# Patient Record
Sex: Female | Born: 1965
Health system: Southern US, Community
[De-identification: ages and names within clinical notes are randomized; demographics above are authoritative.]

## PROBLEM LIST (undated history)

## (undated) DIAGNOSIS — K279 Peptic ulcer, site unspecified, unspecified as acute or chronic, without hemorrhage or perforation: Secondary | ICD-10-CM

## (undated) DIAGNOSIS — F17200 Nicotine dependence, unspecified, uncomplicated: Secondary | ICD-10-CM

## (undated) DIAGNOSIS — L209 Atopic dermatitis, unspecified: Secondary | ICD-10-CM

## (undated) DIAGNOSIS — Z8619 Personal history of other infectious and parasitic diseases: Secondary | ICD-10-CM

## (undated) DIAGNOSIS — E785 Hyperlipidemia, unspecified: Secondary | ICD-10-CM

## (undated) DIAGNOSIS — G43909 Migraine, unspecified, not intractable, without status migrainosus: Secondary | ICD-10-CM

## (undated) DIAGNOSIS — F909 Attention-deficit hyperactivity disorder, unspecified type: Secondary | ICD-10-CM

## (undated) HISTORY — DX: Hyperlipidemia, unspecified: E78.5

## (undated) HISTORY — PX: BACK SURGERY: SHX140

## (undated) HISTORY — DX: Personal history of other infectious and parasitic diseases: Z86.19

## (undated) HISTORY — DX: Nicotine dependence, unspecified, uncomplicated: F17.200

## (undated) HISTORY — DX: Peptic ulcer, site unspecified, unspecified as acute or chronic, without hemorrhage or perforation: K27.9

## (undated) HISTORY — DX: Migraine, unspecified, not intractable, without status migrainosus: G43.909

## (undated) HISTORY — DX: Atopic dermatitis, unspecified: L20.9

---

## 2002-01-22 ENCOUNTER — Inpatient Hospital Stay (HOSPITAL_COMMUNITY): Admission: EM | Admit: 2002-01-22 | Discharge: 2002-01-26 | Payer: Self-pay | Admitting: Psychiatry

## 2002-02-09 ENCOUNTER — Encounter: Admission: RE | Admit: 2002-02-09 | Discharge: 2002-02-09 | Payer: Self-pay | Admitting: *Deleted

## 2002-02-23 ENCOUNTER — Encounter: Admission: RE | Admit: 2002-02-23 | Discharge: 2002-02-23 | Payer: Self-pay | Admitting: *Deleted

## 2002-04-01 ENCOUNTER — Encounter: Admission: RE | Admit: 2002-04-01 | Discharge: 2002-04-01 | Payer: Self-pay | Admitting: *Deleted

## 2002-05-11 ENCOUNTER — Encounter: Admission: RE | Admit: 2002-05-11 | Discharge: 2002-05-11 | Payer: Self-pay | Admitting: *Deleted

## 2003-06-05 ENCOUNTER — Emergency Department (HOSPITAL_COMMUNITY): Admission: EM | Admit: 2003-06-05 | Discharge: 2003-06-05 | Payer: Self-pay | Admitting: Emergency Medicine

## 2003-09-04 ENCOUNTER — Emergency Department (HOSPITAL_COMMUNITY): Admission: AD | Admit: 2003-09-04 | Discharge: 2003-09-04 | Payer: Self-pay | Admitting: Internal Medicine

## 2003-10-11 ENCOUNTER — Emergency Department (HOSPITAL_COMMUNITY): Admission: EM | Admit: 2003-10-11 | Discharge: 2003-10-11 | Payer: Self-pay | Admitting: Family Medicine

## 2004-05-05 ENCOUNTER — Emergency Department (HOSPITAL_COMMUNITY): Admission: EM | Admit: 2004-05-05 | Discharge: 2004-05-05 | Payer: Self-pay | Admitting: Emergency Medicine

## 2004-05-28 ENCOUNTER — Emergency Department (HOSPITAL_COMMUNITY): Admission: EM | Admit: 2004-05-28 | Discharge: 2004-05-28 | Payer: Self-pay | Admitting: Emergency Medicine

## 2004-06-05 ENCOUNTER — Emergency Department (HOSPITAL_COMMUNITY): Admission: EM | Admit: 2004-06-05 | Discharge: 2004-06-05 | Payer: Self-pay | Admitting: Emergency Medicine

## 2004-06-06 ENCOUNTER — Ambulatory Visit: Payer: Self-pay | Admitting: Gastroenterology

## 2004-06-21 ENCOUNTER — Ambulatory Visit: Payer: Self-pay | Admitting: Gastroenterology

## 2004-06-30 ENCOUNTER — Emergency Department (HOSPITAL_COMMUNITY): Admission: EM | Admit: 2004-06-30 | Discharge: 2004-06-30 | Payer: Self-pay | Admitting: Family Medicine

## 2004-07-05 ENCOUNTER — Ambulatory Visit: Payer: Self-pay | Admitting: Gastroenterology

## 2004-07-26 ENCOUNTER — Emergency Department (HOSPITAL_COMMUNITY): Admission: EM | Admit: 2004-07-26 | Discharge: 2004-07-26 | Payer: Self-pay | Admitting: Emergency Medicine

## 2004-12-30 ENCOUNTER — Emergency Department (HOSPITAL_COMMUNITY): Admission: EM | Admit: 2004-12-30 | Discharge: 2004-12-30 | Payer: Self-pay | Admitting: Emergency Medicine

## 2005-02-14 ENCOUNTER — Other Ambulatory Visit: Admission: RE | Admit: 2005-02-14 | Discharge: 2005-02-14 | Payer: Self-pay | Admitting: Family Medicine

## 2005-04-05 ENCOUNTER — Emergency Department (HOSPITAL_COMMUNITY): Admission: EM | Admit: 2005-04-05 | Discharge: 2005-04-05 | Payer: Self-pay | Admitting: Emergency Medicine

## 2005-08-29 ENCOUNTER — Emergency Department (HOSPITAL_COMMUNITY): Admission: EM | Admit: 2005-08-29 | Discharge: 2005-08-29 | Payer: Self-pay | Admitting: Emergency Medicine

## 2005-12-23 ENCOUNTER — Emergency Department (HOSPITAL_COMMUNITY): Admission: EM | Admit: 2005-12-23 | Discharge: 2005-12-23 | Payer: Self-pay | Admitting: Emergency Medicine

## 2006-02-28 ENCOUNTER — Ambulatory Visit: Payer: Self-pay | Admitting: Gastroenterology

## 2006-03-04 ENCOUNTER — Ambulatory Visit: Payer: Self-pay | Admitting: Gastroenterology

## 2006-03-14 ENCOUNTER — Ambulatory Visit: Payer: Self-pay | Admitting: Gastroenterology

## 2006-03-15 ENCOUNTER — Emergency Department (HOSPITAL_COMMUNITY): Admission: EM | Admit: 2006-03-15 | Discharge: 2006-03-15 | Payer: Self-pay | Admitting: Emergency Medicine

## 2006-04-03 ENCOUNTER — Encounter: Admission: RE | Admit: 2006-04-03 | Discharge: 2006-04-03 | Payer: Self-pay | Admitting: Family Medicine

## 2006-04-09 ENCOUNTER — Ambulatory Visit: Payer: Self-pay | Admitting: Gastroenterology

## 2006-04-09 LAB — CONVERTED CEMR LAB
Fecal Occult Blood: NEGATIVE
OCCULT 5: NEGATIVE

## 2006-07-26 ENCOUNTER — Ambulatory Visit: Payer: Self-pay | Admitting: Gastroenterology

## 2007-07-25 ENCOUNTER — Encounter: Admission: RE | Admit: 2007-07-25 | Discharge: 2007-07-25 | Payer: Self-pay | Admitting: Neurology

## 2007-10-23 ENCOUNTER — Ambulatory Visit: Payer: Self-pay | Admitting: Family Medicine

## 2007-12-16 ENCOUNTER — Ambulatory Visit: Payer: Self-pay | Admitting: Family Medicine

## 2008-01-19 ENCOUNTER — Encounter: Payer: Self-pay | Admitting: Gastroenterology

## 2008-02-16 ENCOUNTER — Other Ambulatory Visit: Admission: RE | Admit: 2008-02-16 | Discharge: 2008-02-16 | Payer: Self-pay | Admitting: Family Medicine

## 2008-02-16 ENCOUNTER — Ambulatory Visit: Payer: Self-pay | Admitting: Family Medicine

## 2008-02-16 LAB — HM PAP SMEAR: HM Pap smear: NEGATIVE

## 2008-05-04 ENCOUNTER — Ambulatory Visit: Payer: Self-pay | Admitting: Family Medicine

## 2008-07-01 ENCOUNTER — Ambulatory Visit: Payer: Self-pay | Admitting: Family Medicine

## 2008-07-05 ENCOUNTER — Telehealth: Payer: Self-pay | Admitting: Gastroenterology

## 2008-07-06 ENCOUNTER — Telehealth: Payer: Self-pay | Admitting: Gastroenterology

## 2008-07-12 ENCOUNTER — Encounter: Payer: Self-pay | Admitting: Physician Assistant

## 2008-07-12 ENCOUNTER — Ambulatory Visit: Payer: Self-pay | Admitting: Gastroenterology

## 2008-07-12 DIAGNOSIS — R197 Diarrhea, unspecified: Secondary | ICD-10-CM | POA: Insufficient documentation

## 2008-07-12 DIAGNOSIS — G43909 Migraine, unspecified, not intractable, without status migrainosus: Secondary | ICD-10-CM

## 2008-07-12 DIAGNOSIS — S060X9A Concussion with loss of consciousness of unspecified duration, initial encounter: Secondary | ICD-10-CM

## 2008-07-12 DIAGNOSIS — K6289 Other specified diseases of anus and rectum: Secondary | ICD-10-CM

## 2008-07-12 DIAGNOSIS — S060XAA Concussion with loss of consciousness status unknown, initial encounter: Secondary | ICD-10-CM | POA: Insufficient documentation

## 2008-07-13 LAB — CONVERTED CEMR LAB
Basophils Relative: 0.2 % (ref 0.0–3.0)
Eosinophils Relative: 0.6 % (ref 0.0–5.0)
Hemoglobin: 14.2 g/dL (ref 12.0–15.0)
Lymphocytes Relative: 15 % (ref 12.0–46.0)
Monocytes Relative: 4.2 % (ref 3.0–12.0)
Neutro Abs: 5.6 10*3/uL (ref 1.4–7.7)
RBC: 4.36 M/uL (ref 3.87–5.11)

## 2008-07-14 ENCOUNTER — Telehealth: Payer: Self-pay | Admitting: Physician Assistant

## 2008-07-20 ENCOUNTER — Telehealth: Payer: Self-pay | Admitting: Gastroenterology

## 2008-08-10 ENCOUNTER — Ambulatory Visit: Payer: Self-pay | Admitting: Gastroenterology

## 2008-09-08 ENCOUNTER — Emergency Department (HOSPITAL_COMMUNITY): Admission: EM | Admit: 2008-09-08 | Discharge: 2008-09-08 | Payer: Self-pay | Admitting: Emergency Medicine

## 2008-09-21 ENCOUNTER — Ambulatory Visit: Payer: Self-pay | Admitting: Family Medicine

## 2008-09-28 ENCOUNTER — Ambulatory Visit: Payer: Self-pay | Admitting: Family Medicine

## 2008-11-29 ENCOUNTER — Ambulatory Visit (HOSPITAL_COMMUNITY): Payer: Self-pay | Admitting: Psychiatry

## 2008-12-23 ENCOUNTER — Ambulatory Visit: Payer: Self-pay | Admitting: Family Medicine

## 2009-02-04 ENCOUNTER — Emergency Department (HOSPITAL_COMMUNITY): Admission: EM | Admit: 2009-02-04 | Discharge: 2009-02-04 | Payer: Self-pay | Admitting: Emergency Medicine

## 2009-03-11 ENCOUNTER — Ambulatory Visit: Payer: Self-pay | Admitting: Family Medicine

## 2009-03-30 ENCOUNTER — Ambulatory Visit (HOSPITAL_COMMUNITY): Payer: Self-pay | Admitting: Psychiatry

## 2009-04-01 ENCOUNTER — Ambulatory Visit: Payer: Self-pay | Admitting: Family Medicine

## 2009-04-28 ENCOUNTER — Ambulatory Visit: Payer: Self-pay | Admitting: Family Medicine

## 2009-06-01 ENCOUNTER — Ambulatory Visit: Payer: Self-pay | Admitting: Family Medicine

## 2009-06-08 ENCOUNTER — Ambulatory Visit (HOSPITAL_COMMUNITY): Payer: Self-pay | Admitting: Psychiatry

## 2009-07-11 ENCOUNTER — Ambulatory Visit: Payer: Self-pay | Admitting: Family Medicine

## 2009-07-29 ENCOUNTER — Ambulatory Visit (HOSPITAL_COMMUNITY): Payer: Self-pay | Admitting: Licensed Clinical Social Worker

## 2009-09-26 ENCOUNTER — Ambulatory Visit (HOSPITAL_COMMUNITY): Payer: Self-pay | Admitting: Psychiatry

## 2009-10-05 ENCOUNTER — Ambulatory Visit: Payer: Self-pay | Admitting: Family Medicine

## 2009-11-11 ENCOUNTER — Telehealth: Payer: Self-pay | Admitting: Gastroenterology

## 2009-11-15 ENCOUNTER — Ambulatory Visit: Payer: Self-pay | Admitting: Family Medicine

## 2009-11-23 ENCOUNTER — Ambulatory Visit: Payer: Self-pay | Admitting: Family Medicine

## 2009-11-29 ENCOUNTER — Ambulatory Visit: Payer: Self-pay | Admitting: Family Medicine

## 2010-01-09 ENCOUNTER — Ambulatory Visit (HOSPITAL_COMMUNITY): Payer: Self-pay | Admitting: Psychiatry

## 2010-03-21 ENCOUNTER — Ambulatory Visit: Payer: Self-pay | Admitting: Family Medicine

## 2010-05-03 ENCOUNTER — Ambulatory Visit: Payer: Self-pay | Admitting: Family Medicine

## 2010-06-11 ENCOUNTER — Encounter: Payer: Self-pay | Admitting: Neurology

## 2010-06-22 NOTE — Progress Notes (Signed)
Summary: speak ot nurse  Phone Note Call from Patient Call back at [phone]   Caller: Patient Call For: Jarold Motto Reason for Call: Talk to Nurse Summary of Call: Patient wants to speak to nurse regardig meds that she's taking a since January. Initial call taken by: Tawni Levy,  November 11, 2009 8:06 AM  Follow-up for Phone Call        Pt has been on antiobiotics six times this year for sinus infection.  She is currently on amoxicillin and EES. She has had some diarrhea while on the antiobiotic.  Diarrhea goes away when finishes meds.  Pt concerned about taking so many antiobiotics.  Pt incouraged to take a probiotic while on antiobiotic.  Pt instructed to call for OV if diarrhea worsens or does not go away after completing course of meds. Follow-up by: Ashok Cordia RN,  November 11, 2009 8:31 AM

## 2010-07-14 LAB — HM PAP SMEAR

## 2010-07-14 LAB — RESULTS CONSOLE HPV: CHL HPV: NEGATIVE

## 2010-08-30 LAB — URINALYSIS, ROUTINE W REFLEX MICROSCOPIC
Ketones, ur: NEGATIVE mg/dL
Leukocytes, UA: NEGATIVE
Nitrite: NEGATIVE
Specific Gravity, Urine: 1.004 — ABNORMAL LOW (ref 1.005–1.030)
pH: 6.5 (ref 5.0–8.0)

## 2010-08-30 LAB — PREGNANCY, URINE: Preg Test, Ur: NEGATIVE

## 2010-10-06 NOTE — Discharge Summary (Signed)
NAME:  Vanessa Sutton, Vanessa Sutton NO.:  [id number]   MEDICAL RECORD NO.:  [id number]                   PATIENT TYPE:  IPS   LOCATION:  0503                                 [facility]ILITY[facility]  [facility]   PHYSICIAN:  Jeanice Lim, M.D.              DATE OF BIRTH:  05/25/1965   DATE OF ADMISSION:  01/22/2002  DATE OF DISCHARGE:  01/26/2002                                 DISCHARGE SUMMARY   IDENTIFYING DATA:  This is a 45 year old Caucasian female, divorced,  voluntarily admitted, presented requesting detox.  Drinking since the age of  24.   MEDICATIONS:  Atenolol, Prozac, Imitrex, Percocet.   ALLERGIES:  No known drug allergies.   PHYSICAL EXAMINATION:  Essentially within normal limits.  Neurologically  nonfocal.   LABORATORY DATA:  SGOT and SGPT slightly elevated at 41 and 47.   MENTAL STATUS EXAM:  Medium-built, fully alert, mildly anxious, fidgeting  female.  Speech was within normal limits.  Mood euthymic.  Affect  restricted.  Thought processes goal directed.  Thought content positive  guilt associated with drinking, effects on family.  No dangerous or  psychotic symptoms.  Cognitively intact.  Reporting mild withdrawal  symptoms.   ADMISSION DIAGNOSES:   AXIS I:  1. Alcohol dependence.  2. Depression not otherwise specified.   AXIS II:  None.   AXIS III:  History of migraine headaches.   AXIS IV:  Moderate (problems with primary support group).   AXIS V:  29/60.   HOSPITAL COURSE:  The patient was admitted and ordered routine p.r.n.  medications.  Was placed on a Librium detox protocol for safe withdrawal and  monitored.  The patient tolerated detox.  Reported a decrease in withdrawal  symptoms.  Had no complications.   CONDITION ON DISCHARGE:  Markedly improved.  Mood was more euthymic.  Affect  brighter.  Thought processes goal directed.  Thought content negative for  dangerous ideation and psychotic symptoms.  The patient reported no acute  withdrawal symptoms and motivation to remain sober.   DISCHARGE MEDICATIONS:  1. Prozac 20 mg q.a.m.  2. Atenolol 50 mg at 6 p.m.  3. Trazodone 50 mg q.h.s. p.r.n.   FOLLOW UP:  Hipolito Bayley, M.D. on February 09, 2002 at 2 p.m. and with  AA.   DISCHARGE DIAGNOSES:   AXIS I:  1. Alcohol dependence.  2. Depression not otherwise specified.   AXIS II:  None.   AXIS III:  History of migraine headaches.   AXIS IV:  Moderate (problems with primary support group).   AXIS V:  Global Assessment of Functioning on discharge 55.                                                Jeanice Lim, M.D.  JEM/MEDQ  D:  02/26/2002  T:  02/26/2002  Job:  [id number]

## 2010-10-06 NOTE — Assessment & Plan Note (Signed)
 HEALTHCARE                           GASTROENTEROLOGY OFFICE NOTE   NAME:Vanessa Sutton, Vanessa Sutton                    MRN:          [id number]  DATE:03/14/2006                            DOB:          02/14/1966    HISTORY OF PRESENT ILLNESS:  Vanessa Sutton is much better on the AcipHex therapy,  which I have asked her to continue.  Her endoscopy was unremarkable in  February of 2006 and she had a negative RUT titer.  It was felt at this time  that she most likely had recurrent NSAID-induced ulceration and she  responded well to empiric therapy from her last visit on February 28, 2006.  She did have ultrasound exam performed on March 05, 2006 which was  entirely normal.   PHYSICAL EXAMINATION:  VITAL SIGNS:  Her vital signs are all stable.  Her  weight is 153 pounds.  ABDOMEN:  There is some slight epigastric tenderness but the abdominal exam  otherwise is unremarkable.   PLAN:  I will leave Vanessa Sutton on maintenance AcipHex and check her back in a  couple of months time for followup.  I told her she could probably take  Celebrex without problems for migraine headaches; this is being managed in  [place] through the headache clinic.     Vanessa Sutton. Jarold Motto, MD, Caleen Essex, FAGA    DRP/MedQ  DD: 03/14/2006  DT: 03/15/2006  Job #: [id number]
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
## 2010-10-06 NOTE — Assessment & Plan Note (Signed)
[facility]                         GASTROENTEROLOGY OFFICE NOTE   NAME:Sutton, Vanessa CLONTZ                    MRN:          [id number]  DATE:07/26/2006                            DOB:          1966-04-19    Vanessa Sutton has probably what sounds like cervical spinal stenosis and is  under care of Dr. Avie Echevaria. She denies NSAID use this time but has  taken Kadian 20 mg a day for pain control. She has recently had  recurrent epigastric abdominal pain which we previously saw her last  year. She had a fairly unremarkable endoscopy on July 05, 2004 with  a negative biopsy for Helicobacter pylori. It was felt she most likely  had a resolving NSAID ulceration. She has used AcipHex intermittently  since then. She now describes 2 weeks of burning epigastric abdominal  pain made worse by eating. She has had a previous negative ultrasound on  March 04, 2006. She denies use of aspirin or NSAIDs. Her other  medications;  1. Lorazepam 1 mg a bedtime.  2. Tizanidine 3 times a day.  3. Kadian.  4. She uses p.r.n. migraine nasal sprays and Imitrex injections.   Her appetite is good and her weight is stable. She is having some  constipation associated with her narcotic use. She has had no rectal  bleeding, and had a negative colonoscopy exam on July 05, 2004.   She is a healthy-appearing white female appearing her stated age. She  weighs 145 pounds and blood pressure of 116/78. Pulse was 88 and  regular. I could not appreciate a stigmata of chronic liver disease.  There was no hepatosplenomegaly, abdominal masses, or tenderness. Bowel  sounds were normal.   ASSESSMENT:  1. Chronic recurrent dyspepsia of unexplained etiology. She really      denies true acid reflux symptoms, and denies NSAID use at this      time. Previous ultrasound exam has been normal.  2. Chronic neck pain associated with neurologic disorder, diagnosis      unclear to me at this time.  3. Chronic functional constipation related to pain medications.   RECOMMENDATIONS:  1. Restart AcipHex 20 mg  a day for 4 to 6 weeks as needed.  2. Trial of Amitiza 24 mcg twice a day with meals for constipation.  3. Will ask Dr. Sandria Manly to send [place] his copy of his neurologic report for      our records.  4. Other medications as per her primary care physician Dr. Talmadge Coventry.     Vanessa Rea. Jarold Motto, MD, Caleen Essex, FAGA  Electronically Signed    DRP/MedQ  DD: 07/26/2006  DT: 07/26/2006  Job #: [id number]   cc:   Genene Churn. Love, M.D.  Talmadge Coventry, M.D.

## 2010-12-15 ENCOUNTER — Telehealth: Payer: Self-pay | Admitting: Family Medicine

## 2010-12-15 MED ORDER — AMPHETAMINE-DEXTROAMPHET ER 30 MG PO CP24: 30.0000 mg | ORAL_CAPSULE | Freq: Two times a day (BID) | ORAL | Status: DC

## 2010-12-15 MED ORDER — AMPHETAMINE-DEXTROAMPHET ER 30 MG PO CP24: 30.0000 mg | ORAL_CAPSULE | ORAL | Status: DC

## 2010-12-15 NOTE — Telephone Encounter (Signed)
Adderall renewed for 3 months 

## 2011-03-15 ENCOUNTER — Telehealth: Payer: Self-pay | Admitting: Family Medicine

## 2011-03-15 MED ORDER — AMPHETAMINE-DEXTROAMPHET ER 30 MG PO CP24: 30.0000 mg | ORAL_CAPSULE | ORAL | Status: DC

## 2011-03-15 NOTE — Telephone Encounter (Signed)
Adderall prescription written. She will need an appointment.

## 2011-03-15 NOTE — Telephone Encounter (Signed)
Pt is aware of refill on adderall and knows she needs to come in for a follow-up on medicine

## 2011-03-15 NOTE — Telephone Encounter (Signed)
Her Adderall prescription was written however have her set up an appointment for followup.

## 2011-04-13 ENCOUNTER — Telehealth: Payer: Self-pay | Admitting: Internal Medicine

## 2011-04-16 MED ORDER — AMPHETAMINE-DEXTROAMPHET ER 30 MG PO CP24: 30.0000 mg | ORAL_CAPSULE | ORAL | Status: DC

## 2011-04-16 NOTE — Telephone Encounter (Signed)
Adderall XR30 mg Rx written

## 2011-05-11 ENCOUNTER — Telehealth: Payer: Self-pay | Admitting: Internal Medicine

## 2011-05-11 ENCOUNTER — Telehealth: Payer: Self-pay | Admitting: Family Medicine

## 2011-05-11 MED ORDER — AMPHETAMINE-DEXTROAMPHET ER 30 MG PO CP24: 30.0000 mg | ORAL_CAPSULE | ORAL | Status: DC

## 2011-05-11 NOTE — Telephone Encounter (Signed)
pt needs a refill for adderall XR 30mg  twice a day but is leaving out of town for 2 weeks on the 24th and needs the prescription written for 12/26 because she will get this refill out of town. Also it is time for her yearly check up but her insurance is messed up and she is trying to get that fixed so it will be January before she can come in. Please call her when prescription is ready. Cell # [phone]

## 2011-05-11 NOTE — Telephone Encounter (Signed)
DONE

## 2011-05-11 NOTE — Telephone Encounter (Signed)
Adderall prescription written 

## 2011-05-20 DIAGNOSIS — M25529 Pain in unspecified elbow: Secondary | ICD-10-CM | POA: Insufficient documentation

## 2011-05-21 ENCOUNTER — Emergency Department (INDEPENDENT_AMBULATORY_CARE_PROVIDER_SITE_OTHER): Payer: Medicare Other

## 2011-05-21 ENCOUNTER — Encounter: Payer: Self-pay | Admitting: *Deleted

## 2011-05-21 ENCOUNTER — Emergency Department (HOSPITAL_BASED_OUTPATIENT_CLINIC_OR_DEPARTMENT_OTHER)
Admission: EM | Admit: 2011-05-21 | Discharge: 2011-05-21 | Disposition: A | Discharge status: Home / Self Care | Payer: Medicare Other | Attending: Emergency Medicine | Admitting: Emergency Medicine

## 2011-05-21 DIAGNOSIS — M25522 Pain in left elbow: Secondary | ICD-10-CM

## 2011-05-21 DIAGNOSIS — M79609 Pain in unspecified limb: Secondary | ICD-10-CM

## 2011-05-21 HISTORY — DX: Attention-deficit hyperactivity disorder, unspecified type: F90.9

## 2011-05-21 MED ORDER — HYDROCODONE-ACETAMINOPHEN 5-325 MG PO TABS: 1.0000 | ORAL_TABLET | ORAL | Status: AC | PRN

## 2011-05-21 MED ORDER — IBUPROFEN 600 MG PO TABS: 600.0000 mg | ORAL_TABLET | Freq: Three times a day (TID) | ORAL | Status: AC | PRN

## 2011-05-21 MED ORDER — OXYCODONE-ACETAMINOPHEN 5-325 MG PO TABS
2.0000 | ORAL_TABLET | Freq: Once | ORAL | Status: AC
  Administered 2011-05-21: 1 via ORAL
  Filled 2011-05-21: qty 1

## 2011-05-21 NOTE — ED Notes (Signed)
Pt only given one oxycodone per request, MD aware. +PMS post sling application.

## 2011-05-21 NOTE — ED Notes (Signed)
C/o left lower arm pain x 1 month after injuring it

## 2011-05-21 NOTE — ED Notes (Signed)
Patient transported to X-ray 

## 2011-05-21 NOTE — ED Provider Notes (Signed)
History    This chart was scribed for Lyanne Co, MD, MD by Smitty Pluck. The patient was seen in room MH04 and the patient's care was started at 12:34AM.   CSN: [id number]  Arrival date & time 05/20/11  2347   None     Chief Complaint  Patient presents with  . Arm Injury    (Consider location/radiation/quality/duration/timing/severity/associated sxs/prior treatment) Patient is a 45 y.o. female presenting with arm injury. The history is provided by the patient.  Arm Injury    Vanessa Sutton is a 45 y.o. female who presents to the Emergency Department complaining of moderate left elbow pain onset 1 month ago while working on deck. Pt hit the elbow on marble surface. Pt reports using elbow brace with no relief. Pt reports the pain is constant without radiation. Lifting objects and rotation about the elbow aggravates the pain. Pt reports her arm being stiff during the day. Pt reports hearing "popping" sound while moving her left arm. She denies taking medication for pain. Pt has a hx of bleeding ulcers.   Past Medical History  Diagnosis Date  . ADHD (attention deficit hyperactivity disorder)     History reviewed. No pertinent past surgical history.  History reviewed. No pertinent family history.  History  Substance Use Topics  . Smoking status: Never Smoker   . Smokeless tobacco: Not on file  . Alcohol Use: Yes    OB History    Grav Para Term Preterm Abortions TAB SAB Ect Mult Living                  Review of Systems  All other systems reviewed and are negative.   10 Systems reviewed and are negative for acute change except as noted in the HPI.  Allergies  Review of patient's allergies indicates no known allergies.  Home Medications   Current Outpatient Rx  Name Route Sig Dispense Refill  . AMPHETAMINE-DEXTROAMPHET ER 30 MG PO CP24 Oral Take 1 capsule (30 mg total) by mouth every morning. 60 capsule 0    BP 114/79  Pulse 99  Temp(Src) 97.5 F (36.4  C) (Oral)  Resp 16  Ht 5\' 2"  (1.575 m)  Wt 130 lb (58.968 kg)  BMI 23.78 kg/m2  SpO2 100%  LMP 05/07/2011  Physical Exam  Nursing note and vitals reviewed. Constitutional: She is oriented to person, place, and time. She appears well-developed and well-nourished. No distress.  HENT:  Head: Normocephalic and atraumatic.  Eyes: EOM are normal. Pupils are equal, round, and reactive to light.  Neck: Normal range of motion. Neck supple. No tracheal deviation present.  Cardiovascular: Normal rate, regular rhythm and normal heart sounds.        Nl radial pulses bilaterally  Pulmonary/Chest: Effort normal. No respiratory distress.  Abdominal: Soft. She exhibits no distension. There is no tenderness.  Musculoskeletal: Normal range of motion.       No warmth or swelling of left upper extremity Tenderness over left radial head and olecranon Pain with supination of left forearm   No tenderness or swelling of left humerus Nl left clavicle  Nl rom of left shoulder  Pain with flexion of left elbow   Neurological: She is alert and oriented to person, place, and time.  Skin: Skin is warm and dry.  Psychiatric: She has a normal mood and affect. Her behavior is normal.    ED Course  Procedures (including critical care time)  DIAGNOSTIC STUDIES: Oxygen Saturation is 100% on room  air, normal by my interpretation.    COORDINATION OF CARE:    Labs Reviewed - No data to display Dg Forearm Left  05/21/2011  *RADIOLOGY REPORT*  Clinical Data: Left lower arm pain 1 month after injury.  LEFT FOREARM - 2 VIEW  Comparison: None.  Findings: The left radius and ulna appear intact.  No evidence of acute fracture or subluxation.  No focal bone lesion or bone destruction.  Bone cortex and trabecular architecture appear intact.  No abnormal radiopaque foreign bodies in the soft tissues.  IMPRESSION: No acute bony abnormalities identified.  Original Report Authenticated By: Marlon Pel, M.D.   I  personally reviewed the x-ray  1. Left elbow pain       MDM  X-ray is normal here in the emergency department.  The patient was sent home with a prescription for pain medication as well as a sling for comfort.  She's been referred to sports medicine for followup      I personally performed the services described in this documentation, which was scribed in my presence. The recorded information has been reviewed and considered.    Lyanne Co, MD 05/21/11 [phone]

## 2011-06-18 ENCOUNTER — Telehealth: Payer: Self-pay | Admitting: Family Medicine

## 2011-06-18 MED ORDER — AMPHETAMINE-DEXTROAMPHET ER 30 MG PO CP24: 30.0000 mg | ORAL_CAPSULE | ORAL | Status: DC

## 2011-06-18 NOTE — Telephone Encounter (Signed)
Adderall XR 30 renewed 

## 2011-06-26 ENCOUNTER — Encounter: Payer: Self-pay | Admitting: Internal Medicine

## 2011-07-02 ENCOUNTER — Encounter: Payer: Medicare Other | Admitting: Family Medicine

## 2011-07-16 ENCOUNTER — Encounter: Payer: Medicare Other | Admitting: Family Medicine

## 2011-07-27 ENCOUNTER — Telehealth: Payer: Self-pay | Admitting: Family Medicine

## 2011-07-27 MED ORDER — AMPHETAMINE-DEXTROAMPHET ER 30 MG PO CP24: 30.0000 mg | ORAL_CAPSULE | ORAL | Status: DC

## 2011-07-27 NOTE — Telephone Encounter (Signed)
Adderall renewed. She has an appointment in early April.

## 2011-08-22 ENCOUNTER — Encounter: Payer: Medicare Other | Admitting: Family Medicine

## 2011-08-29 ENCOUNTER — Telehealth: Payer: Self-pay | Admitting: Family Medicine

## 2011-08-29 NOTE — Telephone Encounter (Signed)
LM

## 2011-09-05 ENCOUNTER — Telehealth: Payer: Self-pay | Admitting: Family Medicine

## 2011-09-05 NOTE — Telephone Encounter (Signed)
she needs to come in for an appointment. Check the record but I think she has canceled or not shown for the last couple of appointments.

## 2011-09-05 NOTE — Telephone Encounter (Signed)
Pt informed that no more refills till she comes in for appt pt said she would call back

## 2011-10-01 ENCOUNTER — Ambulatory Visit (INDEPENDENT_AMBULATORY_CARE_PROVIDER_SITE_OTHER): Payer: Self-pay | Admitting: Family Medicine

## 2011-10-01 ENCOUNTER — Encounter: Payer: Self-pay | Admitting: Family Medicine

## 2011-10-01 VITALS — BP 150/80 | HR 114 | Wt 128.0 lb

## 2011-10-01 DIAGNOSIS — F988 Other specified behavioral and emotional disorders with onset usually occurring in childhood and adolescence: Secondary | ICD-10-CM

## 2011-10-01 DIAGNOSIS — G43909 Migraine, unspecified, not intractable, without status migrainosus: Secondary | ICD-10-CM

## 2011-10-01 MED ORDER — AMPHETAMINE-DEXTROAMPHET ER 30 MG PO CP24: 30.0000 mg | ORAL_CAPSULE | ORAL | Status: DC

## 2011-10-01 MED ORDER — AMPHETAMINE-DEXTROAMPHET ER 30 MG PO CP24: 30.0000 mg | ORAL_CAPSULE | Freq: Two times a day (BID) | ORAL | Status: DC

## 2011-10-01 MED ORDER — PROMETHAZINE HCL 50 MG PO TABS: 50.0000 mg | ORAL_TABLET | Freq: Four times a day (QID) | ORAL | Status: DC | PRN
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
## 2011-12-27 ENCOUNTER — Telehealth: Payer: Self-pay | Admitting: Family Medicine

## 2011-12-27 MED ORDER — AMPHETAMINE-DEXTROAMPHET ER 30 MG PO CP24: 30.0000 mg | ORAL_CAPSULE | Freq: Two times a day (BID) | ORAL | Status: DC

## 2011-12-27 MED ORDER — AMPHETAMINE-DEXTROAMPHET ER 30 MG PO CP24: 30.0000 mg | ORAL_CAPSULE | ORAL | Status: DC

## 2011-12-27 NOTE — Telephone Encounter (Signed)
Adderall renewed.

## 2011-12-27 NOTE — Telephone Encounter (Signed)
LM

## 2012-02-24 IMAGING — CR DG FOREARM 2V*L*
2 series · 2 of 2 positions shown · non-contrast
Comparison: None.

CLINICAL DATA: Left lower arm pain 1 month after injury.

LEFT FOREARM - 2 VIEW

[x forearm ap left]
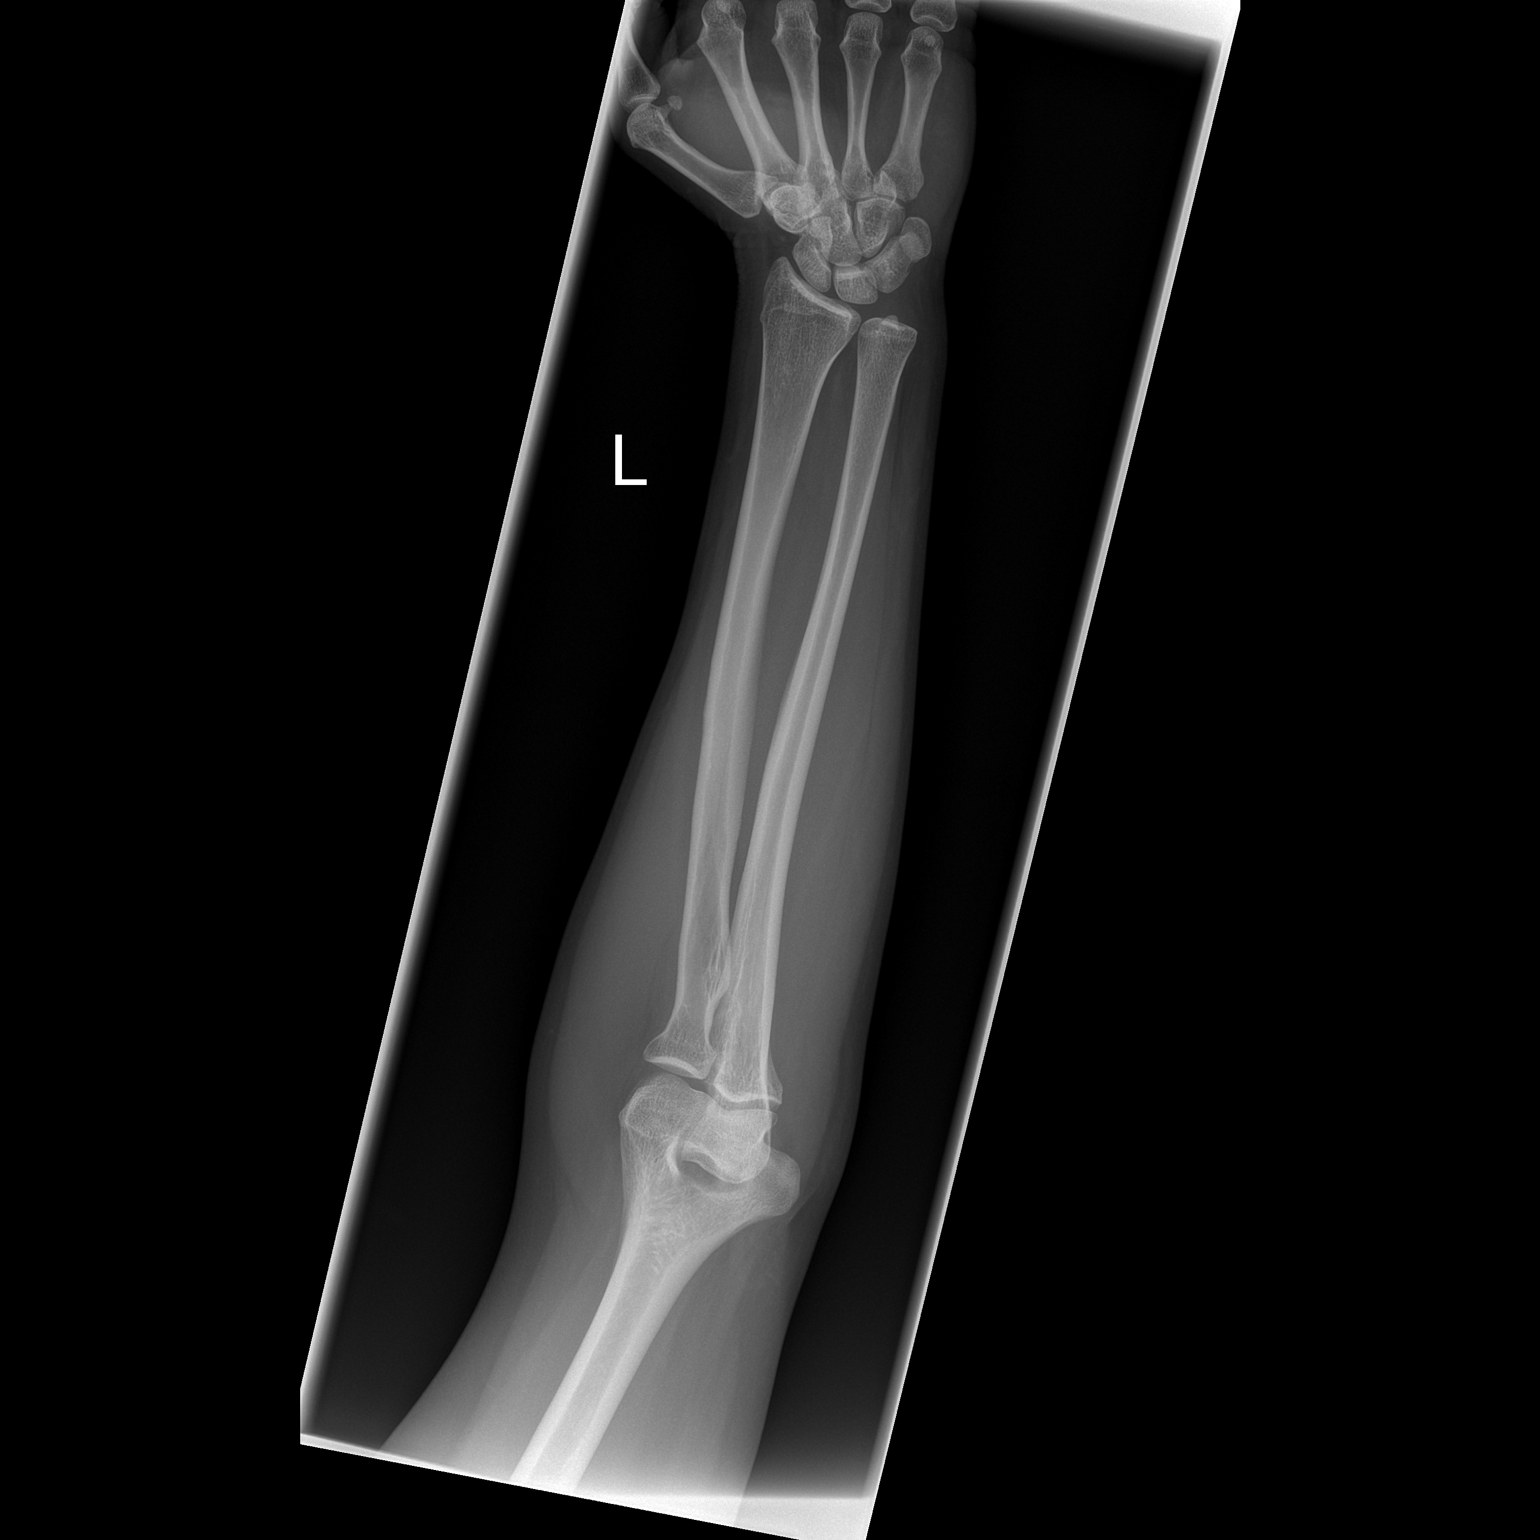

[x forearm lat left]
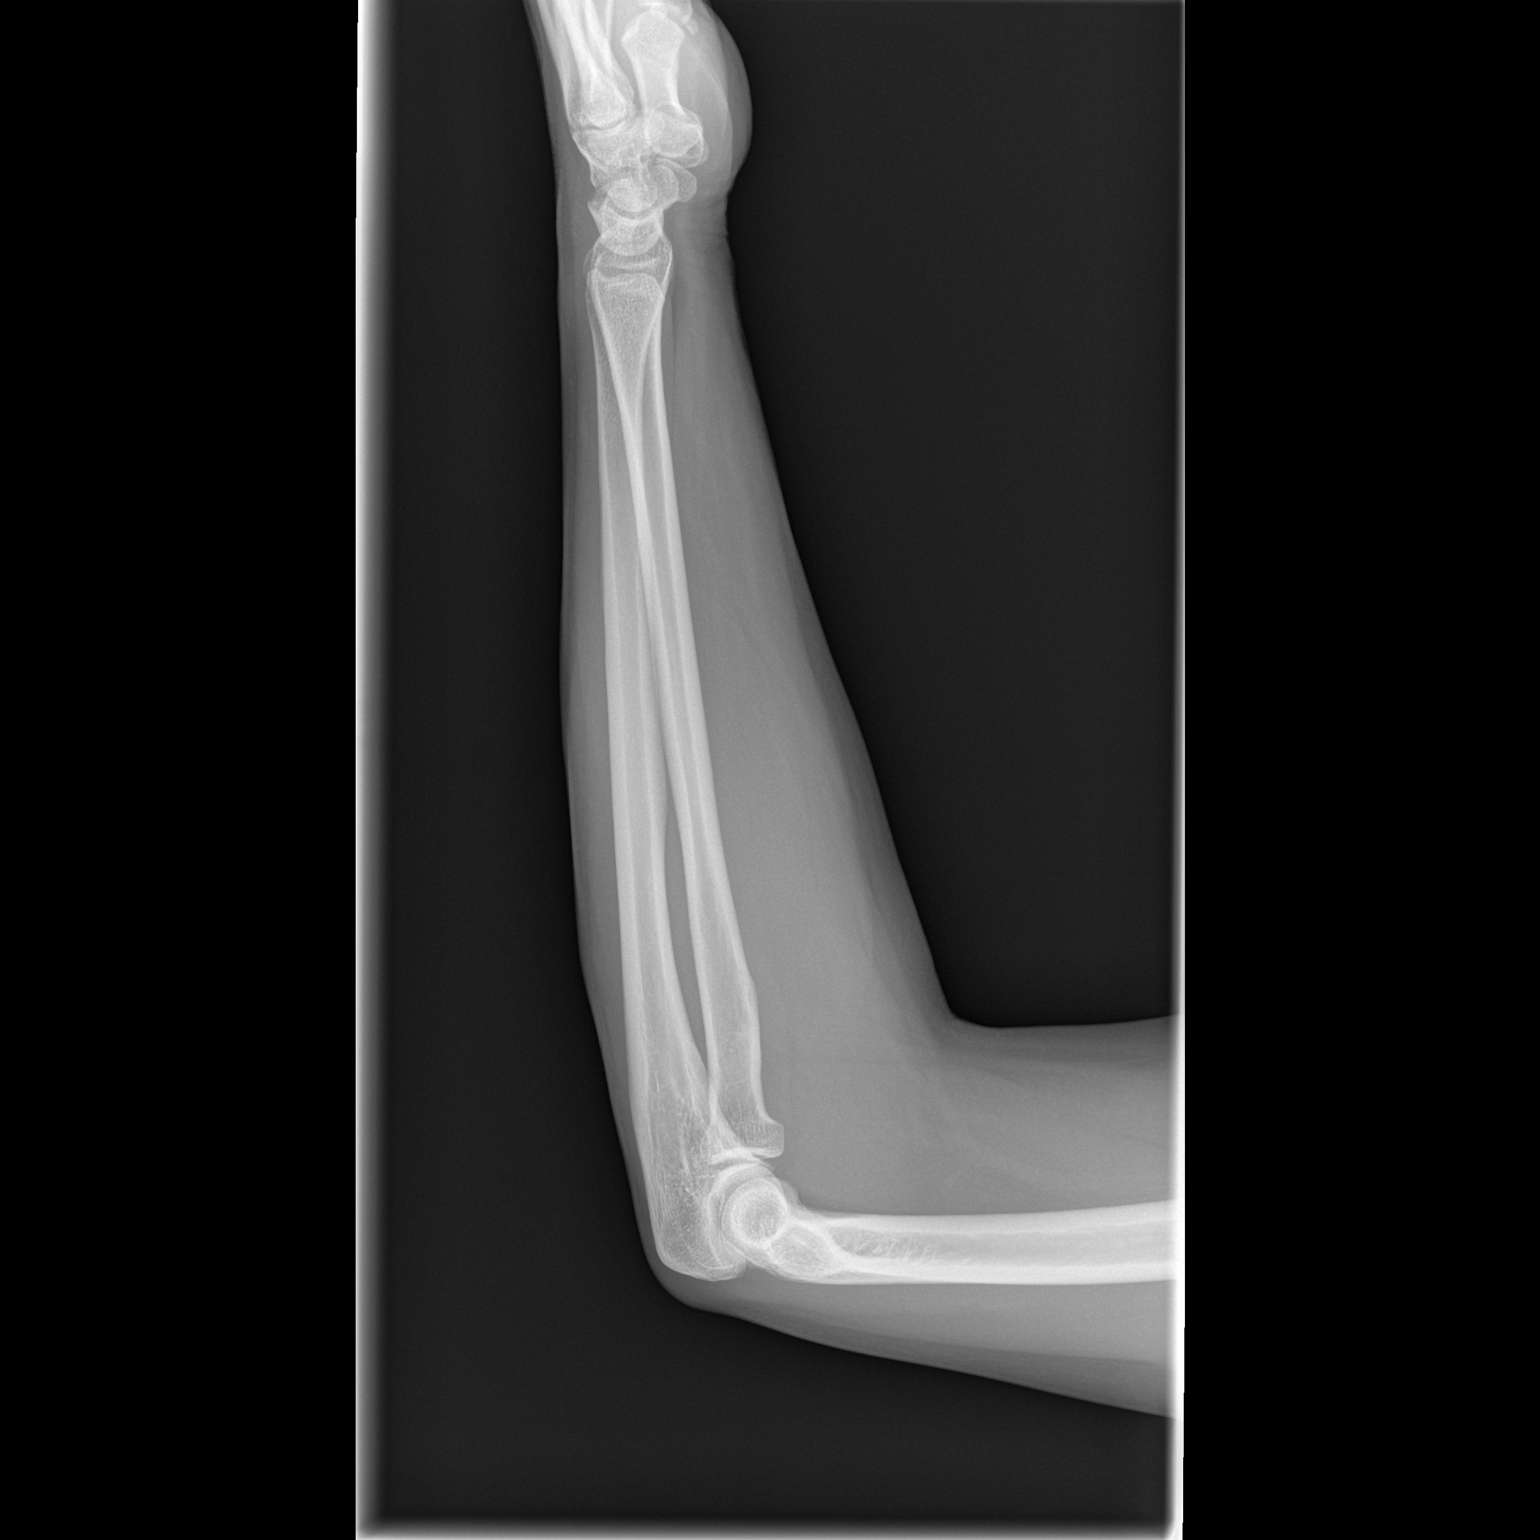

[2 of 2 positions shown; findings below may reference images not displayed]

FINDINGS: The left radius and ulna appear intact.  No evidence of
acute fracture or subluxation.  No focal bone lesion or bone
destruction.  Bone cortex and trabecular architecture appear
intact.  No abnormal radiopaque foreign bodies in the soft tissues.
IMPRESSION: No acute bony abnormalities identified.

## 2012-02-27 ENCOUNTER — Ambulatory Visit: Payer: Self-pay | Admitting: Medical

## 2012-02-29 ENCOUNTER — Emergency Department (HOSPITAL_COMMUNITY)
Admission: EM | Admit: 2012-02-29 | Discharge: 2012-02-29 | Disposition: A | Discharge status: Home / Self Care | Payer: Medicare Other | Attending: Emergency Medicine | Admitting: Emergency Medicine

## 2012-02-29 ENCOUNTER — Encounter (HOSPITAL_COMMUNITY): Payer: Self-pay | Admitting: Emergency Medicine

## 2012-02-29 DIAGNOSIS — IMO0001 Reserved for inherently not codable concepts without codable children: Secondary | ICD-10-CM | POA: Diagnosis not present

## 2012-02-29 DIAGNOSIS — A6000 Herpesviral infection of urogenital system, unspecified: Secondary | ICD-10-CM | POA: Insufficient documentation

## 2012-02-29 DIAGNOSIS — Z888 Allergy status to other drugs, medicaments and biological substances status: Secondary | ICD-10-CM | POA: Diagnosis not present

## 2012-02-29 DIAGNOSIS — F909 Attention-deficit hyperactivity disorder, unspecified type: Secondary | ICD-10-CM | POA: Insufficient documentation

## 2012-02-29 DIAGNOSIS — F17200 Nicotine dependence, unspecified, uncomplicated: Secondary | ICD-10-CM | POA: Diagnosis not present

## 2012-02-29 DIAGNOSIS — T63301A Toxic effect of unspecified spider venom, accidental (unintentional), initial encounter: Secondary | ICD-10-CM

## 2012-02-29 DIAGNOSIS — T63391A Toxic effect of venom of other spider, accidental (unintentional), initial encounter: Secondary | ICD-10-CM | POA: Insufficient documentation

## 2012-02-29 DIAGNOSIS — E785 Hyperlipidemia, unspecified: Secondary | ICD-10-CM | POA: Insufficient documentation

## 2012-02-29 DIAGNOSIS — Y9210 Unspecified residential institution as the place of occurrence of the external cause: Secondary | ICD-10-CM | POA: Insufficient documentation

## 2012-02-29 MED ORDER — DIPHENHYDRAMINE HCL 25 MG PO CAPS: 50.0000 mg | ORAL_CAPSULE | Freq: Once | ORAL | Status: DC

## 2012-02-29 MED ORDER — TETANUS-DIPHTH-ACELL PERTUSSIS 5-2.5-18.5 LF-MCG/0.5 IM SUSP
0.5000 mL | Freq: Once | INTRAMUSCULAR | Status: AC
  Administered 2012-02-29: 0.5 mL via INTRAMUSCULAR
  Filled 2012-02-29: qty 0.5

## 2012-02-29 MED ORDER — ERYTHROMYCIN BASE 250 MG PO TABS: 250.0000 mg | ORAL_TABLET | Freq: Four times a day (QID) | ORAL | Status: DC

## 2012-02-29 NOTE — ED Notes (Addendum)
Pt presenting to ed with c/o getting bit by a spider on her left wrist pt states positive fever. Pt states site is getting bigger and pain is getting worse. Pt states positive nausea from bite. Pt states she drank 2 shots today prior to coming into ER for her pain

## 2012-02-29 NOTE — ED Provider Notes (Signed)
History     CSN: [id number]  Arrival date & time 02/29/12  1704   First MD Initiated Contact with Patient 02/29/12 1831      Chief Complaint  Patient presents with  . Insect Bite    (Consider location/radiation/quality/duration/timing/severity/associated sxs/prior treatment) HPI Comments: Patient is a 46 year old female presents emergency department complaining of spider bite.  She reports that she believes she was bit by a problem occurs spider.  The incident occurred earlier in this afternoon.  She describes a quarter-sized spider with long legs.  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  The [place]. Patient states that originally the lesion started as a blister and now it looks like it's spreading.  She denies any myalgias, headache, spreading rash, purpura, petechiae, fevers, chills, abdominal pain, nausea, vomiting or other systemic symptoms.  Patient status is not up-to-date.   The history is provided by the patient.    Past Medical History  Diagnosis Date  . ADHD (attention deficit hyperactivity disorder)   . Migraine headache   . Dyslipidemia   . PUD (peptic ulcer disease)   . Atopic dermatitis   . Smoker   . Genital herpes     Past Surgical History  Procedure Date  . Back surgery     No family history on file.  History  Substance Use Topics  . Smoking status: Current Every Day Smoker -- 0.5 packs/day    Types: Cigarettes  . Smokeless tobacco: Not on file  . Alcohol Use: Yes     2 shots today    OB History    Grav Para Term Preterm Abortions TAB SAB Ect Mult Living                  Review of Systems  All other systems reviewed and are negative.    Allergies  Prednisone and Other  Home Medications   Current Outpatient Rx  Name Route Sig Dispense Refill  . AMPHETAMINE-DEXTROAMPHET ER 30 MG PO CP24 Oral Take 1 capsule (30 mg total) by mouth 2 (two) times daily. 60 capsule 0    BP  128/64  Pulse 99  Temp 97.8 F (36.6 C) (Oral)  Resp 20  SpO2 95%  LMP 02/26/2012  Physical Exam  Nursing note and vitals reviewed. Constitutional: She is oriented to person, place, and time. She appears well-developed and well-nourished. No distress.  HENT:  Head: Normocephalic and atraumatic.  Eyes: Conjunctivae normal and EOM are normal.  Neck: Normal range of motion.  Pulmonary/Chest: Effort normal.  Musculoskeletal: Normal range of motion.  Neurological: She is alert and oriented to person, place, and time.  Skin: Skin is warm and dry. No rash noted. She is not diaphoretic.       2 cm round area located on anterior surface of left wrist with superficial abrasion appearing rash.  There is no target lesion, papule, pustule,  Petechiae, purpura or other rash.   Psychiatric: She has a normal mood and affect. Her behavior is normal.    ED Course  Procedures (including critical care time)  Labs Reviewed - No data to display No results found.   No diagnosis found.    MDM  Spider bite  Patient presents emergency department with concern of a brown recluse spider bite.  Patient has refused blood work due to not having the time for her test results and for financial concerns.  Tetanus shot given he well in  the emergency department.  No evidence of skin necrosis or systemic infection on physical exam. Pt advised for daily wound checks with her PCP for the next 4 days. Strict  return precautions discussed (including signs of necrosis, cellulitis, jaundice).  Patient verbalizes understanding.      Jaci Carrel, [place] 02/29/12 7829

## 2012-03-01 NOTE — ED Provider Notes (Signed)
Medical screening examination/treatment/procedure(s) were performed by non-physician practitioner and as supervising physician I was immediately available for consultation/collaboration.  Jones Skene, M.D.     Jones Skene, MD 03/01/12 [phone]

## 2012-04-02 ENCOUNTER — Telehealth: Payer: Self-pay | Admitting: Family Medicine

## 2012-04-02 MED ORDER — AMPHETAMINE-DEXTROAMPHET ER 30 MG PO CP24: 30.0000 mg | ORAL_CAPSULE | ORAL | Status: DC

## 2012-04-02 MED ORDER — AMPHETAMINE-DEXTROAMPHET ER 30 MG PO CP24: 30.0000 mg | ORAL_CAPSULE | Freq: Two times a day (BID) | ORAL | Status: DC

## 2012-04-02 NOTE — Telephone Encounter (Signed)
Adderall renewed.

## 2012-04-03 ENCOUNTER — Telehealth: Payer: Self-pay | Admitting: Family Medicine

## 2012-04-03 NOTE — Telephone Encounter (Signed)
LM

## 2012-07-07 ENCOUNTER — Telehealth: Payer: Self-pay | Admitting: Family Medicine

## 2012-07-09 ENCOUNTER — Other Ambulatory Visit: Payer: Self-pay | Admitting: Medical

## 2012-07-09 MED ORDER — AMPHETAMINE-DEXTROAMPHET ER 30 MG PO CP24: 30.0000 mg | ORAL_CAPSULE | Freq: Two times a day (BID) | ORAL | Status: DC

## 2012-07-09 NOTE — Telephone Encounter (Signed)
SHANE WROTE 30 DY SCRIPT AND PT HAS APPT ON MON 2-24

## 2012-07-14 ENCOUNTER — Encounter: Payer: Self-pay | Admitting: Family Medicine

## 2012-08-11 ENCOUNTER — Encounter: Payer: Self-pay | Admitting: Family Medicine

## 2012-08-11 ENCOUNTER — Ambulatory Visit (INDEPENDENT_AMBULATORY_CARE_PROVIDER_SITE_OTHER): Payer: Medicare Other | Admitting: Family Medicine

## 2012-08-11 VITALS — BP 118/82 | HR 98 | Wt 135.0 lb

## 2012-08-11 DIAGNOSIS — G43909 Migraine, unspecified, not intractable, without status migrainosus: Secondary | ICD-10-CM

## 2012-08-11 DIAGNOSIS — F988 Other specified behavioral and emotional disorders with onset usually occurring in childhood and adolescence: Secondary | ICD-10-CM | POA: Diagnosis not present

## 2012-08-11 MED ORDER — AMPHETAMINE-DEXTROAMPHET ER 30 MG PO CP24: 30.0000 mg | ORAL_CAPSULE | Freq: Two times a day (BID) | ORAL | Status: DC

## 2012-08-11 MED ORDER — AMPHETAMINE-DEXTROAMPHET ER 30 MG PO CP24: 30.0000 mg | ORAL_CAPSULE | ORAL | Status: DC
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
## 2012-09-04 ENCOUNTER — Telehealth: Payer: Self-pay | Admitting: Family Medicine

## 2012-09-04 NOTE — Telephone Encounter (Signed)
I TOLD HER I RECOMMEND IF SHE IS THAT CONCERNED TO GO TO URGENT CARE AND SHE STS SHE WAS TOLD BY YOU TO CALL IF SHE NEEDED ANYTHING AND NOT TO GO TO ER BUT TO CALL HERE FIRST.  I ADVISED WE ARE CLOSED TOMORROW BUT WILL SEND YOU MESSAGE.  SHE REQUESTED THAT YOU CALL HER AT [phone]

## 2012-09-05 MED ORDER — VALACYCLOVIR HCL 1 G PO TABS: ORAL_TABLET | ORAL | Status: DC

## 2012-09-05 NOTE — Telephone Encounter (Signed)
Valtrex called in for Herpes Labialis

## 2012-09-09 ENCOUNTER — Encounter: Payer: Medicare Other | Admitting: Family Medicine

## 2012-09-30 ENCOUNTER — Encounter: Payer: Self-pay | Admitting: Family Medicine

## 2012-10-06 ENCOUNTER — Telehealth: Payer: Self-pay | Admitting: Family Medicine

## 2012-10-06 NOTE — Telephone Encounter (Signed)
Pt left me a voice mail that she has called and cancelled her last cpe and not a no show.  I tried to call her back on [phone] and the number was not good.  No other number for pt.  Also there are several no shows and cancellations on pt appt schedule.

## 2012-10-09 ENCOUNTER — Telehealth: Payer: Self-pay | Admitting: Family Medicine

## 2012-10-10 NOTE — Telephone Encounter (Signed)
My notes indicate she should have a prescription for May 24

## 2012-10-10 NOTE — Telephone Encounter (Signed)
CALLED PT HAD TO LEAVE MESSAGE THAT DR.LALONDE WROTE HER RX FOR 3/24,4/24,AND5/24 SO SHE SHOULDN'T NEED ANY REFILLS YET

## 2012-11-12 ENCOUNTER — Telehealth: Payer: Self-pay | Admitting: Internal Medicine

## 2012-11-12 DIAGNOSIS — F988 Other specified behavioral and emotional disorders with onset usually occurring in childhood and adolescence: Secondary | ICD-10-CM

## 2012-11-12 NOTE — Telephone Encounter (Signed)
Pt needs a refill on adderall XR 30mg  twice a day. Call when ready

## 2012-11-13 MED ORDER — AMPHETAMINE-DEXTROAMPHET ER 30 MG PO CP24: 30.0000 mg | ORAL_CAPSULE | ORAL | Status: DC

## 2012-11-13 MED ORDER — AMPHETAMINE-DEXTROAMPHET ER 30 MG PO CP24: 30.0000 mg | ORAL_CAPSULE | Freq: Two times a day (BID) | ORAL | Status: DC

## 2012-12-12 ENCOUNTER — Encounter: Payer: Self-pay | Admitting: Medical

## 2012-12-12 ENCOUNTER — Ambulatory Visit (INDEPENDENT_AMBULATORY_CARE_PROVIDER_SITE_OTHER): Payer: Medicare Other | Admitting: Medical

## 2012-12-12 VITALS — BP 120/80 | HR 110 | Temp 98.0°F | Resp 18 | Wt 123.0 lb

## 2012-12-12 DIAGNOSIS — T148 Other injury of unspecified body region: Secondary | ICD-10-CM

## 2012-12-12 DIAGNOSIS — F430 Acute stress reaction: Secondary | ICD-10-CM | POA: Diagnosis not present

## 2012-12-12 DIAGNOSIS — J329 Chronic sinusitis, unspecified: Secondary | ICD-10-CM | POA: Diagnosis not present

## 2012-12-12 DIAGNOSIS — W57XXXA Bitten or stung by nonvenomous insect and other nonvenomous arthropods, initial encounter: Secondary | ICD-10-CM | POA: Diagnosis not present

## 2012-12-12 MED ORDER — DOXYCYCLINE HYCLATE 100 MG PO TABS: 100.0000 mg | ORAL_TABLET | Freq: Two times a day (BID) | ORAL | Status: DC

## 2012-12-12 MED ORDER — ALPRAZOLAM 0.5 MG PO TABS: ORAL_TABLET | ORAL | Status: DC

## 2012-12-12 MED ORDER — VALACYCLOVIR HCL 1 G PO TABS: ORAL_TABLET | ORAL | Status: DC

## 2012-12-12 NOTE — Progress Notes (Signed)
Subjective: Here for multiple issues.    Here for tick bite.  Had itch on right hip yesterday, went to scratch and pulled a tick off.  Tick was really small, red. Now has a red spot on her hip, bruised.  Feels like a fever is coming on.   She also notes few week hx/o sinus pressure, congestion in nose, fatigue, not feeling well.   Has hx/o sinusitis.   She is under a lot of stress.  Came here from her step father's funeral today, stressed about this and she broke up with her boyfriend of 20 years today.   She requests a few xanax to help get through the emotions of the next few days.  In the past Dr. Susann Givens has given her occasional xanax for particularly stressful times.       Past Medical History  Diagnosis Date  . ADHD (attention deficit hyperactivity disorder)   . Migraine headache   . Dyslipidemia   . PUD (peptic ulcer disease)   . Atopic dermatitis   . Smoker   . Genital herpes       Objective: Filed Vitals:   12/12/12 1553  BP: 120/80  Pulse: 110  Temp: 98 F (36.7 C)  Resp: 18    General appearance: alert, no distress, WD/WN, somewhat anxious appearing white female HEENT: normocephalic, sclerae anicteric, maxillary sinus tenderness, TMs pearly, nares with turbinate edema, mild purulent discharge,mild erythema, pharynx with mild erythema Oral cavity: MMM, no lesions Neck: supple, no lymphadenopathy, no thyromegaly, no masses Lungs: CTA bilaterally, no wheezes, rhonchi, or rales Skin: right thigh with small ecchymosis, purplish, small 2mm ulceration where she ripped the tick off, no induration, no warmth, no fluctuance, no other rash   Assessment: Encounter Diagnoses  Name Primary?  . Sinusitis Yes  . Acute stress reaction   . Tick bite     Plan: Sinusitis - begin doxycyline, rest, nasal saline, hydrate well, call if not improving Acute stress - short term script of xanax given.  Discussed her concerns, medications, social situation.   F/u prn.   Tick bite -  reassured, advised that tick bite does not appear worrisome at this time.  discussed possible worrisome signs/symptoms Follow-up prn

## 2012-12-18 ENCOUNTER — Telehealth: Payer: Self-pay | Admitting: Internal Medicine

## 2012-12-18 MED ORDER — DOXYCYCLINE HYCLATE 100 MG PO TABS: 100.0000 mg | ORAL_TABLET | Freq: Two times a day (BID) | ORAL | Status: DC

## 2012-12-18 NOTE — Telephone Encounter (Signed)
Sent med in per jcl 

## 2012-12-18 NOTE — Telephone Encounter (Signed)
Explained to her that there was no evidence of Lyme disease. Give her another week's worth of doxycycline and if still difficulty then have her make an appointment.

## 2012-12-18 NOTE — Telephone Encounter (Signed)
Pt states that her fever is coming back from the lime disease when she saw shane last week. Pt would like another round of antibotics or something cause she does not want to keep getting this

## 2012-12-18 NOTE — Telephone Encounter (Signed)
Pt informed word for word  

## 2013-02-26 ENCOUNTER — Telehealth: Payer: Self-pay | Admitting: Family Medicine

## 2013-02-26 DIAGNOSIS — F988 Other specified behavioral and emotional disorders with onset usually occurring in childhood and adolescence: Secondary | ICD-10-CM

## 2013-02-26 MED ORDER — AMPHETAMINE-DEXTROAMPHET ER 30 MG PO CP24: 30.0000 mg | ORAL_CAPSULE | Freq: Two times a day (BID) | ORAL | Status: DC

## 2013-02-26 NOTE — Telephone Encounter (Signed)
Pt is requesting a 3 month supply refill of her Adderall 30 mg XR.  Please call pt when ready.

## 2013-02-26 NOTE — Telephone Encounter (Signed)
lm

## 2013-05-04 ENCOUNTER — Telehealth: Payer: Self-pay | Admitting: Family Medicine

## 2013-05-19 NOTE — Telephone Encounter (Signed)
P.a. Form in paper chart

## 2013-05-25 ENCOUNTER — Encounter (HOSPITAL_COMMUNITY): Payer: Self-pay | Admitting: Emergency Medicine

## 2013-05-25 ENCOUNTER — Emergency Department (HOSPITAL_COMMUNITY)
Admission: EM | Admit: 2013-05-25 | Discharge: 2013-05-25 | Disposition: A | Discharge status: Home / Self Care | Payer: Medicare Other | Attending: Emergency Medicine | Admitting: Emergency Medicine

## 2013-05-25 ENCOUNTER — Emergency Department (HOSPITAL_COMMUNITY): Payer: Medicare Other

## 2013-05-25 DIAGNOSIS — S6990XA Unspecified injury of unspecified wrist, hand and finger(s), initial encounter: Principal | ICD-10-CM | POA: Insufficient documentation

## 2013-05-25 DIAGNOSIS — F17200 Nicotine dependence, unspecified, uncomplicated: Secondary | ICD-10-CM | POA: Insufficient documentation

## 2013-05-25 DIAGNOSIS — S59919A Unspecified injury of unspecified forearm, initial encounter: Secondary | ICD-10-CM | POA: Diagnosis not present

## 2013-05-25 DIAGNOSIS — M25539 Pain in unspecified wrist: Secondary | ICD-10-CM | POA: Diagnosis not present

## 2013-05-25 DIAGNOSIS — Z8711 Personal history of peptic ulcer disease: Secondary | ICD-10-CM | POA: Diagnosis not present

## 2013-05-25 DIAGNOSIS — Z79899 Other long term (current) drug therapy: Secondary | ICD-10-CM | POA: Diagnosis not present

## 2013-05-25 DIAGNOSIS — Z8619 Personal history of other infectious and parasitic diseases: Secondary | ICD-10-CM | POA: Diagnosis not present

## 2013-05-25 DIAGNOSIS — Z8679 Personal history of other diseases of the circulatory system: Secondary | ICD-10-CM | POA: Insufficient documentation

## 2013-05-25 DIAGNOSIS — Z8639 Personal history of other endocrine, nutritional and metabolic disease: Secondary | ICD-10-CM | POA: Insufficient documentation

## 2013-05-25 DIAGNOSIS — Z862 Personal history of diseases of the blood and blood-forming organs and certain disorders involving the immune mechanism: Secondary | ICD-10-CM | POA: Diagnosis not present

## 2013-05-25 DIAGNOSIS — F909 Attention-deficit hyperactivity disorder, unspecified type: Secondary | ICD-10-CM | POA: Diagnosis not present

## 2013-05-25 DIAGNOSIS — M25522 Pain in left elbow: Secondary | ICD-10-CM

## 2013-05-25 DIAGNOSIS — Z872 Personal history of diseases of the skin and subcutaneous tissue: Secondary | ICD-10-CM | POA: Diagnosis not present

## 2013-05-25 DIAGNOSIS — S59909A Unspecified injury of unspecified elbow, initial encounter: Secondary | ICD-10-CM | POA: Diagnosis not present

## 2013-05-25 MED ORDER — TRAMADOL HCL 50 MG PO TABS: 50.0000 mg | ORAL_TABLET | Freq: Four times a day (QID) | ORAL | Status: DC | PRN
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
## 2013-05-25 NOTE — Discharge Instructions (Signed)
Take the prescribed medication as directed. May wish to ice and elevate the elbow to help with pain and swelling. Follow-up with Dr. Mina MarbleWeingold if no improvement in 1 week. Return to the ED for new or worsening symptoms.
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
## 2013-05-28 NOTE — ED Provider Notes (Signed)
Medical screening examination/treatment/procedure(s) were performed by non-physician practitioner and as supervising physician I was immediately available for consultation/collaboration  Vanessa Sutton Vanessa Jaliyah Fotheringham, MD 05/28/13 1719 

## 2013-06-01 ENCOUNTER — Telehealth: Payer: Self-pay | Admitting: Internal Medicine

## 2013-06-01 DIAGNOSIS — F988 Other specified behavioral and emotional disorders with onset usually occurring in childhood and adolescence: Secondary | ICD-10-CM

## 2013-06-01 MED ORDER — AMPHETAMINE-DEXTROAMPHET ER 30 MG PO CP24: 30.0000 mg | ORAL_CAPSULE | Freq: Two times a day (BID) | ORAL | Status: DC

## 2013-06-01 NOTE — Telephone Encounter (Signed)
Pt needs a refill on her adderall. Call when ready. Pt has a med check appt with you on Friday at 2

## 2013-06-05 ENCOUNTER — Encounter: Payer: Medicare Other | Admitting: Family Medicine

## 2013-06-05 DIAGNOSIS — Z79899 Other long term (current) drug therapy: Secondary | ICD-10-CM

## 2013-06-16 ENCOUNTER — Encounter: Payer: Self-pay | Admitting: Family Medicine

## 2013-06-16 ENCOUNTER — Telehealth: Payer: Self-pay | Admitting: Family Medicine

## 2013-06-16 DIAGNOSIS — M25529 Pain in unspecified elbow: Secondary | ICD-10-CM | POA: Diagnosis not present

## 2013-06-16 NOTE — Telephone Encounter (Signed)
Dismissal letter sent.

## 2013-06-25 ENCOUNTER — Telehealth: Payer: Self-pay | Admitting: Family Medicine

## 2013-06-25 NOTE — Telephone Encounter (Signed)
Pt called to discuss why she was discharged.  I explained we had sent her a no show letter in May and then she No Showed again in December.  That this is very costly and we cannot continue.  Pt said she was sorry, she had gotten a new calendar from her mom and she had missed lots of her appointments in December.  Pt said she is disabled and Dr. Susann GivensLalonde knows her and has cared for her for some time.  I explained I would talk with Dr. Elbert EwingsL ., which I did.  Pt is agreeable to paying 50.00 no show fee and her balance of 149.13 from March 2014 and July 2014 of 149.13 for a total of 199.13 before Fri d/13.  Pt will then call me to set up her next appointment.  Pt is aware if she no shows again, she will not get another chance.

## 2013-07-13 ENCOUNTER — Telehealth: Payer: Self-pay | Admitting: Family Medicine

## 2013-07-13 DIAGNOSIS — F988 Other specified behavioral and emotional disorders with onset usually occurring in childhood and adolescence: Secondary | ICD-10-CM

## 2013-07-13 MED ORDER — AMPHETAMINE-DEXTROAMPHET ER 30 MG PO CP24: 30.0000 mg | ORAL_CAPSULE | Freq: Two times a day (BID) | ORAL | Status: DC
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
## 2013-07-17 ENCOUNTER — Encounter: Payer: Self-pay | Admitting: Family Medicine

## 2013-07-17 ENCOUNTER — Ambulatory Visit (INDEPENDENT_AMBULATORY_CARE_PROVIDER_SITE_OTHER): Payer: Medicare Other | Admitting: Family Medicine

## 2013-07-17 VITALS — BP 130/78 | HR 88 | Wt 131.0 lb

## 2013-07-17 DIAGNOSIS — G43909 Migraine, unspecified, not intractable, without status migrainosus: Secondary | ICD-10-CM

## 2013-07-17 DIAGNOSIS — F988 Other specified behavioral and emotional disorders with onset usually occurring in childhood and adolescence: Secondary | ICD-10-CM | POA: Insufficient documentation

## 2013-07-17 MED ORDER — AMPHETAMINE-DEXTROAMPHET ER 25 MG PO CP24: 25.0000 mg | ORAL_CAPSULE | ORAL | Status: DC

## 2013-07-18 NOTE — Progress Notes (Signed)
   Subjective:    Patient ID: Orvan Julylison J Benevides, female    DOB: 03/05/1966, 48 y.o.   MRN: [id number]  HPI She is here for followup visit concerning her ADD and migraines. Her present Adderall dosing works well for her underlying ADD allow her to stay focused and on task for her she states that it makes her slightly anxious. She also mentions that it helps mitigate her migraine headaches. She has a long and complicated history of migraine headaches and apparently multiple medications have not been beneficial. She takes care of her medications now with over-the-counter medicines.   Review of Systems     Objective:   Physical Exam Alert and in no distress otherwise not examined.       Assessment & Plan:  MIGRAINE HEADACHE  ADD (attention deficit disorder) - Plan: amphetamine-dextroamphetamine (ADDERALL XR) 25 MG 24 hr capsule  I discussed serious options with her concerning her ADD. She apparently has tried Vyvanse in the past and did not find it helpful. I will decrease the dosing of her Adderall to 25 mg and see if that controls her ADD symptoms and also cuts down on the anxiety. She is to call and let me know how this works.

## 2013-08-20 ENCOUNTER — Telehealth: Payer: Self-pay | Admitting: Family Medicine

## 2013-08-20 ENCOUNTER — Other Ambulatory Visit: Payer: Self-pay | Admitting: Medical

## 2013-08-20 DIAGNOSIS — F988 Other specified behavioral and emotional disorders with onset usually occurring in childhood and adolescence: Secondary | ICD-10-CM

## 2013-08-20 MED ORDER — AMPHETAMINE-DEXTROAMPHET ER 25 MG PO CP24: 25.0000 mg | ORAL_CAPSULE | ORAL | Status: DC

## 2013-08-20 NOTE — Telephone Encounter (Signed)
Prescription ready

## 2013-08-20 NOTE — Telephone Encounter (Signed)
Called pt to inform that Adderall XR 25MG  #60 script ready for pick up.

## 2013-08-26 ENCOUNTER — Telehealth: Payer: Self-pay | Admitting: Family Medicine

## 2013-08-26 DIAGNOSIS — F988 Other specified behavioral and emotional disorders with onset usually occurring in childhood and adolescence: Secondary | ICD-10-CM

## 2013-08-26 MED ORDER — AMPHETAMINE-DEXTROAMPHET ER 25 MG PO CP24
25.0000 mg | ORAL_CAPSULE | Freq: Two times a day (BID) | ORAL | Status: DC
Start: 2013-08-26 — End: 2013-10-26

## 2013-08-26 MED ORDER — AMPHETAMINE-DEXTROAMPHET ER 25 MG PO CP24: 25.0000 mg | ORAL_CAPSULE | Freq: Two times a day (BID) | ORAL | Status: DC

## 2013-08-26 MED ORDER — AMPHETAMINE-DEXTROAMPHET ER 25 MG PO CP24: 25.0000 mg | ORAL_CAPSULE | ORAL | Status: DC

## 2013-08-26 NOTE — Telephone Encounter (Signed)
A prescription written on April 4 was daily which should really be twice a day. I will write new prescriptions for her. The other prescription will be torn up

## 2013-09-10 ENCOUNTER — Encounter: Payer: Self-pay | Admitting: Family Medicine

## 2013-09-10 ENCOUNTER — Ambulatory Visit (INDEPENDENT_AMBULATORY_CARE_PROVIDER_SITE_OTHER): Payer: Medicare Other | Admitting: Family Medicine

## 2013-09-10 VITALS — BP 140/82 | HR 84 | Ht <= 58 in | Wt 129.0 lb

## 2013-09-10 DIAGNOSIS — R5383 Other fatigue: Secondary | ICD-10-CM

## 2013-09-10 DIAGNOSIS — R61 Generalized hyperhidrosis: Secondary | ICD-10-CM

## 2013-09-10 DIAGNOSIS — Z79899 Other long term (current) drug therapy: Secondary | ICD-10-CM

## 2013-09-10 DIAGNOSIS — F988 Other specified behavioral and emotional disorders with onset usually occurring in childhood and adolescence: Secondary | ICD-10-CM

## 2013-09-10 DIAGNOSIS — E785 Hyperlipidemia, unspecified: Secondary | ICD-10-CM | POA: Diagnosis not present

## 2013-09-10 DIAGNOSIS — R5381 Other malaise: Secondary | ICD-10-CM | POA: Diagnosis not present

## 2013-09-10 DIAGNOSIS — G43909 Migraine, unspecified, not intractable, without status migrainosus: Secondary | ICD-10-CM

## 2013-09-10 LAB — COMPREHENSIVE METABOLIC PANEL
ALT: 191 U/L — AB (ref 0–35)
AST: 185 U/L — ABNORMAL HIGH (ref 0–37)
Albumin: 5 g/dL (ref 3.5–5.2)
Alkaline Phosphatase: 54 U/L (ref 39–117)
BUN: 8 mg/dL (ref 6–23)
CHLORIDE: 100 meq/L (ref 96–112)
CO2: 24 mEq/L (ref 19–32)
Calcium: 9.9 mg/dL (ref 8.4–10.5)
Creat: 0.51 mg/dL (ref 0.50–1.10)
Glucose, Bld: 82 mg/dL (ref 70–99)
POTASSIUM: 4.2 meq/L (ref 3.5–5.3)
SODIUM: 136 meq/L (ref 135–145)
TOTAL PROTEIN: 7.6 g/dL (ref 6.0–8.3)
Total Bilirubin: 0.6 mg/dL (ref 0.2–1.2)

## 2013-09-10 LAB — POCT URINALYSIS DIPSTICK
Bilirubin, UA: NEGATIVE
Blood, UA: NEGATIVE
GLUCOSE UA: NEGATIVE
KETONES UA: NEGATIVE
LEUKOCYTES UA: NEGATIVE
Nitrite, UA: NEGATIVE
Protein, UA: NEGATIVE
UROBILINOGEN UA: NEGATIVE
pH, UA: 5

## 2013-09-10 LAB — CBC WITH DIFFERENTIAL/PLATELET
Basophils Absolute: 0.1 10*3/uL (ref 0.0–0.1)
Basophils Relative: 2 % — ABNORMAL HIGH (ref 0–1)
Eosinophils Absolute: 0 10*3/uL (ref 0.0–0.7)
Eosinophils Relative: 1 % (ref 0–5)
HCT: 38.9 % (ref 36.0–46.0)
HEMOGLOBIN: 13.4 g/dL (ref 12.0–15.0)
LYMPHS PCT: 34 % (ref 12–46)
Lymphs Abs: 1.2 10*3/uL (ref 0.7–4.0)
MCH: 34 pg (ref 26.0–34.0)
MCHC: 34.4 g/dL (ref 30.0–36.0)
MCV: 98.7 fL (ref 78.0–100.0)
MONOS PCT: 11 % (ref 3–12)
Monocytes Absolute: 0.4 10*3/uL (ref 0.1–1.0)
NEUTROS ABS: 1.8 10*3/uL (ref 1.7–7.7)
NEUTROS PCT: 52 % (ref 43–77)
PLATELETS: 288 10*3/uL (ref 150–400)
RBC: 3.94 MIL/uL (ref 3.87–5.11)
RDW: 13.7 % (ref 11.5–15.5)
WBC: 3.5 10*3/uL — AB (ref 4.0–10.5)

## 2013-09-10 LAB — LIPID PANEL
CHOLESTEROL: 280 mg/dL — AB (ref 0–200)
HDL: 130 mg/dL (ref >39)
LDL CALC: 136 mg/dL — AB (ref 0–99)
Total CHOL/HDL Ratio: 2.2 Ratio
Triglycerides: 68 mg/dL (ref <150)
VLDL: 14 mg/dL (ref 0–40)

## 2013-09-10 NOTE — Progress Notes (Deleted)
Subjective:    Patient ID: Vanessa Sutton, female    DOB: 02/05/1966, 48 y.o.   MRN: [id number]  HPI  Ms. Vanessa Sutton is a very pleasant 48 y.o. yo female who  has a past medical history of ADHD (attention deficit hyperactivity disorder); Migraine headache; Dyslipidemia; Smoker; and Genital herpes. She presents today for medication follow up.   Starting a few months ago the patient began having night sweats. In the last month or so the night sweats have been occuring much more frequently and the sweating episodes are lasting longer. When the patient is sleeping or laying down, the patient describes completely drenching/soaking sweats. These can occur when the patient is napping as well, but normally these only occur at night when asleep. She also has had increasing fatigue which began around the same time. The patient describes the feeling as an overall sense of not feeling well, and describes it as feeling similar to when she was sick in the hospital with blood staph infection and pneumonia several years ago. The patient is unsure if she is feverish during these episodes, but the person with her did comment that she felt very hot. The patient denies any weight loss, increase in headaches or muscle aches, changes in her periods. Patient notes that she has been under much more stress for the last month or so, though her symptoms pre-date the onset of her increased stress.   In regards to her other medical conditions, the patient recently decreased her dose of aderall 1-2 months ago and has noticed an improvement in her side effects but still feels like she can function and that her symptoms are well controlled. The patient also feels that her migraines have generally improved, they occur only 1-2 days a week now and she is able to manage them with rest, ear plugs and water in a dark room. In regards to her genital herpes the patient reports that she has not had an outbreak since 1995 and is not  currently on suppressive medication.   The patient notes that her diet is "awful" The patient states that she eats junk food all of the time and she no longer cooks and largely relies on fast food. The patient would like to set a goal today of eating vegetables at least three times a week. The patient does feel like she exercises a lot, stating that she is very active in both her work and also enjoys walking most days of the week. The patient continues to smoke 1/2 ppd and drink between 3-5 drinks three or so times a week. She is not currently interested in cutting back on either of these.  In regards to her family life the patient states that her son is currently in college and doing very well and is in good health. The patient is currently dating someone and while she feels safe in the relationship, notes that the relationship has ups and downs. Patient declines screening  In terms of preventative care the patient is up to date on her tetanus shot, however has never had a mammogram and has not had an eye exam in two years. The patient states that the reason for this is a lack of healthcare access. The patient had a pap smear two years ago with her OBGYN however those records are not available in our system. Regular breast exams   Review of Systems is negative except per HPI.     Objective:   Physical Exam  Constitutional:  Patient is well-developed, well-nourished, and in no distress. HENT: Head is normocephalic and atraumatic.  Mouth/Throat: Oropharynx is clear and moist without erythema or exudates Eyes: Conjunctivae and EOM are normal. Pupils are equal, round, and reactive to light.  Ears: mild erythema  Neck: Normal range of motion. Neck supple.  Cardiovascular: Normal rate, regular rhythm. Exam reveals no murmurs, gallops and no friction rub.  Pulmonary/Chest: Effort normal and CTAB. No respiratory distress. No wheezes or ronchi.   Abdominal: Soft, non-tender and non-distended. There is  no rebound or guarding. No HSM.  Neurological: Patient is alert and oriented to person, place, and time.  Reflex Scores: Biceps and Patellar reflexes were 2+ and equal bilaterally.  Skin: Skin is warm and dry. No rash noted.  Psychiatric: Affect normal.     Assessment & Plan:

## 2013-09-10 NOTE — Progress Notes (Signed)
Subjective:    Patient ID: Vanessa Sutton, female    DOB: 05/10/1966, 48 y.o.   MRN: [id number]  HPI  Vanessa Sutton is a very pleasant 48 y.o. yo female who  has a past medical history of ADHD (attention deficit hyperactivity disorder); Migraine headache; Dyslipidemia; Smoker; and Genital herpes. She presents today for medication follow up.   Starting a few months ago the patient began having night sweats. In the last month or so the night sweats have been occuring much more frequently and the sweating episodes are lasting longer. When the patient is sleeping or laying down, the patient describes completely drenching/soaking sweats. These can occur when the patient is napping as well, but normally these only occur at night when asleep. She also has had increasing fatigue which began around the same time. The patient describes the feeling as an overall sense of not feeling well, and describes it as feeling similar to when she was sick in the hospital with blood staph infection and pneumonia several years ago. The patient is unsure if she is feverish during these episodes, but the person with her did comment that she felt very hot. The patient denies any weight loss, increase in headaches or muscle aches, changes in her periods. Patient notes that she has been under much more stress for the last month or so, though her symptoms pre-date the onset of her increased stress.   In regards to her other medical conditions, the patient recently decreased her dose of aderall 1-2 months ago and has noticed an improvement in her side effects but still feels like she can function and that her symptoms are well controlled. The patient also feels that her migraines have generally improved, they occur only 1-2 days a week now and she is able to manage them with rest, ear plugs and water in a dark room. In regards to her genital herpes the patient reports that she has not had an outbreak since 1995 and is not  currently on suppressive medication.   The patient notes that her diet is "awful" The patient states that she eats junk food all of the time and she no longer cooks and largely relies on fast food. The patient would like to set a goal today of eating vegetables at least three times a week. The patient does feel like she exercises a lot, stating that she is very active in both her work and also enjoys walking most days of the week. The patient continues to smoke 1/2 ppd and drink between 3-5 drinks three or so times a week. She is not currently interested in cutting back on either of these.  In regards to her family life the patient states that her son is currently in college and doing very well and is in good health. The patient is currently dating someone and while she feels safe in the relationship, notes that the relationship has ups and downs. Patient declines screening  In terms of preventative care the patient is up to date on her tetanus shot, however has never had a mammogram and has not had an eye exam in two years. The patient states that the reason for this is a lack of healthcare access. The patient had a pap smear two years ago with her OBGYN however those records are not available in our system. Regular breast exams   Review of Systems is negative except per HPI.     Objective:   Physical Exam  Constitutional:  Patient is well-developed, well-nourished, and in no distress. HENT: Head is normocephalic and atraumatic.  Mouth/Throat: Oropharynx is clear and moist without erythema or exudates Eyes: Conjunctivae and EOM are normal. Pupils are equal, round, and reactive to light.  Ears: mild erythema  Neck: Normal range of motion. Neck supple.  Cardiovascular: Normal rate, regular rhythm. Exam reveals no murmurs, gallops and no friction rub.  Pulmonary/Chest: Effort normal and CTAB. No respiratory distress. No wheezes or ronchi.   Abdominal: Soft, non-tender and non-distended. There is  no rebound or guarding. No HSM.  Neurological: Patient is alert and oriented to person, place, and time.  Reflex Scores: Biceps and Patellar reflexes were 2+ and equal bilaterally.  Skin: Skin is warm and dry. No rash noted.  Psychiatric: Affect normal.     Assessment & Plan:  Night sweats - Plan: CBC with Differential, Comprehensive metabolic panel, TSH, Urinalysis Dipstick, CANCELED: PPD  ADD (attention deficit disorder)  Migraine, unspecified, without mention of intractable migraine without mention of status migrainosus  Other malaise and fatigue - Plan: CBC with Differential, Comprehensive metabolic panel, Lipid panel, TSH, CANCELED: PPD  Dyslipidemia - Plan: Lipid panel  Encounter for long-term (current) use of other medications - Plan: CBC with Differential, Comprehensive metabolic panel, Lipid panel, Urinalysis Dipstick

## 2013-09-11 LAB — TSH: TSH: 2.053 u[IU]/mL (ref 0.350–4.500)

## 2013-09-14 ENCOUNTER — Other Ambulatory Visit (INDEPENDENT_AMBULATORY_CARE_PROVIDER_SITE_OTHER): Payer: Medicare Other

## 2013-09-14 DIAGNOSIS — Z111 Encounter for screening for respiratory tuberculosis: Secondary | ICD-10-CM

## 2013-09-14 DIAGNOSIS — Z1159 Encounter for screening for other viral diseases: Secondary | ICD-10-CM | POA: Diagnosis not present

## 2013-09-14 DIAGNOSIS — R748 Abnormal levels of other serum enzymes: Secondary | ICD-10-CM

## 2013-09-15 LAB — HEPATITIS A ANTIBODY, TOTAL: HEP A TOTAL AB: NONREACTIVE

## 2013-09-15 LAB — HEPATITIS C ANTIBODY: HCV Ab: NEGATIVE

## 2013-09-17 LAB — HEPATITIS B E ANTIBODY: Hepatitis Be Antibody: NONREACTIVE

## 2013-09-17 NOTE — Addendum Note (Signed)
Addended by: Barbette OrLOWE, SABRINA A on: 09/17/2013 05:03 PM   Modules accepted: Orders

## 2013-09-21 ENCOUNTER — Other Ambulatory Visit: Payer: Medicare Other

## 2013-09-22 ENCOUNTER — Other Ambulatory Visit: Payer: Medicare Other

## 2013-10-26 ENCOUNTER — Other Ambulatory Visit: Payer: Self-pay | Admitting: Family Medicine

## 2013-10-26 DIAGNOSIS — F988 Other specified behavioral and emotional disorders with onset usually occurring in childhood and adolescence: Secondary | ICD-10-CM

## 2013-10-26 MED ORDER — AMPHETAMINE-DEXTROAMPHET ER 25 MG PO CP24: 25.0000 mg | ORAL_CAPSULE | Freq: Two times a day (BID) | ORAL | Status: DC

## 2013-10-26 MED ORDER — AMPHETAMINE-DEXTROAMPHET ER 25 MG PO CP24: 25.0000 mg | ORAL_CAPSULE | ORAL | Status: DC

## 2013-12-14 ENCOUNTER — Telehealth: Payer: Self-pay | Admitting: Family Medicine

## 2013-12-14 ENCOUNTER — Ambulatory Visit (INDEPENDENT_AMBULATORY_CARE_PROVIDER_SITE_OTHER): Payer: Medicare Other | Admitting: Family Medicine

## 2013-12-14 ENCOUNTER — Encounter: Payer: Self-pay | Admitting: Family Medicine

## 2013-12-14 VITALS — BP 110/70 | HR 100 | Wt 127.0 lb

## 2013-12-14 DIAGNOSIS — IMO0002 Reserved for concepts with insufficient information to code with codable children: Secondary | ICD-10-CM

## 2013-12-14 MED ORDER — EPINEPHRINE 0.3 MG/0.3ML IJ SOAJ: 0.3000 mg | Freq: Once | INTRAMUSCULAR | Status: DC

## 2013-12-14 NOTE — Telephone Encounter (Signed)
Have her call me tomorrow

## 2013-12-14 NOTE — Telephone Encounter (Signed)
CALLED PT TO INFORM HER JCL WANTS HER TO CALL HERE BACK 7/28 LEFT WORD FOR WORD MESSAGE

## 2013-12-14 NOTE — Telephone Encounter (Signed)
THE RED HAS NOT SPREAD BUT IT IS GETTING MORE RED ANKLE IS SWELLING MORE [facility]

## 2013-12-14 NOTE — Patient Instructions (Signed)
If the redness gets worse, give me a call

## 2013-12-14 NOTE — Progress Notes (Signed)
   Subjective:    Patient ID: Vanessa Sutton, female    DOB: 05/27/1965, 48 y.o.   MRN: [id number]  HPI Last Thursday while doing some yard work, she felt something slightly painful on her left foot. It was slightly itchy but not very painful. In the last several days she has noted slightly more erythema around the original wound.   Review of Systems     Objective:   Physical Exam Exam of left foot shows a long erythematous area with central scabbing. It is approximately 2 x 3 cm.       Assessment & Plan:  Insect bite of foot or toe, left  explained that this did not necessarily look like an infection but did outline it with an ink pen. She will call me tomorrow if there is any question about infection. She didn't discuss the fact that she is concerned that she has lost some of her Adderall to the girlfriend of her son. I discussed various options with her and essentially recommended trying to video document any potential loss of her medications

## 2013-12-15 ENCOUNTER — Telehealth: Payer: Self-pay | Admitting: Family Medicine

## 2013-12-15 NOTE — Telephone Encounter (Signed)
Pt states the worse was yesterday afternoon, been better today, didn't go outside of the pen line, swelling is down.  Teeth marks are still questionable as to whether they are infected or not.  The Tail (she said you would know what she meant) has gotten longer, about 1 1/2 inch since yesterday. But over all better

## 2013-12-16 ENCOUNTER — Ambulatory Visit: Payer: Medicare Other | Admitting: Family Medicine

## 2013-12-24 ENCOUNTER — Telehealth: Payer: Self-pay | Admitting: Internal Medicine

## 2013-12-24 NOTE — Telephone Encounter (Signed)
PT INFORMED AND HAS APPOINTMENT 8/7

## 2013-12-24 NOTE — Telephone Encounter (Signed)
Pt states the red area need the teeth marks of her leg are 3x bigger than they were and getting worse. She would like to be put on antibotics. Send to [facility] @ [address]

## 2013-12-24 NOTE — Telephone Encounter (Signed)
Tell her I need to see these before I can give anything

## 2013-12-25 ENCOUNTER — Ambulatory Visit (INDEPENDENT_AMBULATORY_CARE_PROVIDER_SITE_OTHER): Payer: Medicare Other | Admitting: Family Medicine

## 2013-12-25 ENCOUNTER — Encounter: Payer: Self-pay | Admitting: Family Medicine

## 2013-12-25 VITALS — BP 114/80 | HR 115 | Temp 98.0°F | Wt 124.0 lb

## 2013-12-25 DIAGNOSIS — L02419 Cutaneous abscess of limb, unspecified: Secondary | ICD-10-CM

## 2013-12-25 DIAGNOSIS — L03119 Cellulitis of unspecified part of limb: Secondary | ICD-10-CM

## 2013-12-25 DIAGNOSIS — L03116 Cellulitis of left lower limb: Secondary | ICD-10-CM

## 2013-12-25 MED ORDER — ERYTHROMYCIN ETHYLSUCCINATE 400 MG PO TABS: 400.0000 mg | ORAL_TABLET | Freq: Three times a day (TID) | ORAL | Status: DC

## 2013-12-25 NOTE — Progress Notes (Signed)
   Subjective:    Patient ID: Vanessa Sutton, female    DOB: 07/09/1965, 48 y.o.   MRN: [id number]  HPI He is here for recheck. She is concerned about the redness in the area of the original lesion. It has been 2 weeks since she still does have erythema. She also notes some lesions present on the posterior aspect of her upper thigh and she is concerned about infection.   Review of Systems     Objective:   Physical Exam The medial aspect of the right lower leg near the ankle does show a 4 cm area of erythema. She also has scattered erythematous lesions on the posterior aspect of her right upper thigh. No adenopathy is noted.       Assessment & Plan:  Cellulitis of left lower extremity - Plan: erythromycin ethylsuccinate (EES) 400 MG tablet  I discussed all this in detail with her. Explained that I did not think she was necessarily infection related but feel that a course of antibiotic to clear this up would be useful. If no improvement when the antibiotic and I would blame this on slow resolution of whatever the toxin was that the insect injected.

## 2014-03-08 ENCOUNTER — Telehealth: Payer: Self-pay | Admitting: Family Medicine

## 2014-03-08 DIAGNOSIS — F988 Other specified behavioral and emotional disorders with onset usually occurring in childhood and adolescence: Secondary | ICD-10-CM

## 2014-03-08 MED ORDER — AMPHETAMINE-DEXTROAMPHET ER 25 MG PO CP24: 25.0000 mg | ORAL_CAPSULE | Freq: Two times a day (BID) | ORAL | Status: DC

## 2014-03-08 MED ORDER — AMPHETAMINE-DEXTROAMPHET ER 25 MG PO CP24
25.0000 mg | ORAL_CAPSULE | Freq: Two times a day (BID) | ORAL | Status: DC
Start: 2014-04-08 — End: 2014-06-07

## 2014-03-08 NOTE — Telephone Encounter (Signed)
Adderall renewed.

## 2014-03-09 ENCOUNTER — Telehealth: Payer: Self-pay | Admitting: Family Medicine

## 2014-03-09 NOTE — Telephone Encounter (Signed)
lm

## 2014-06-07 ENCOUNTER — Telehealth: Payer: Self-pay | Admitting: Internal Medicine

## 2014-06-07 DIAGNOSIS — F988 Other specified behavioral and emotional disorders with onset usually occurring in childhood and adolescence: Secondary | ICD-10-CM

## 2014-06-07 MED ORDER — AMPHETAMINE-DEXTROAMPHET ER 25 MG PO CP24: 25.0000 mg | ORAL_CAPSULE | Freq: Two times a day (BID) | ORAL | Status: DC

## 2014-06-07 NOTE — Telephone Encounter (Signed)
Pt states she needs a refill on her adderall 25mg  24 hour. Please call when ready. She is going out of town tomorrow

## 2014-09-06 ENCOUNTER — Telehealth: Payer: Self-pay | Admitting: Family Medicine

## 2014-09-06 DIAGNOSIS — F988 Other specified behavioral and emotional disorders with onset usually occurring in childhood and adolescence: Secondary | ICD-10-CM

## 2014-09-06 NOTE — Telephone Encounter (Signed)
Pt called and made a medcheck appt for may. She needs refills of adderall xr 20 mg. Please call [phone] when ready.

## 2014-09-08 MED ORDER — AMPHETAMINE-DEXTROAMPHET ER 25 MG PO CP24: 25.0000 mg | ORAL_CAPSULE | Freq: Two times a day (BID) | ORAL | Status: DC

## 2014-09-29 ENCOUNTER — Ambulatory Visit (INDEPENDENT_AMBULATORY_CARE_PROVIDER_SITE_OTHER): Payer: Medicare Other | Admitting: Family Medicine

## 2014-09-29 ENCOUNTER — Encounter: Payer: Self-pay | Admitting: Family Medicine

## 2014-09-29 VITALS — BP 122/88 | HR 108 | Wt 126.0 lb

## 2014-09-29 DIAGNOSIS — H60543 Acute eczematoid otitis externa, bilateral: Secondary | ICD-10-CM

## 2014-09-29 DIAGNOSIS — F909 Attention-deficit hyperactivity disorder, unspecified type: Secondary | ICD-10-CM | POA: Diagnosis not present

## 2014-09-29 DIAGNOSIS — F988 Other specified behavioral and emotional disorders with onset usually occurring in childhood and adolescence: Secondary | ICD-10-CM

## 2014-09-29 MED ORDER — AMPHETAMINE-DEXTROAMPHET ER 20 MG PO CP24: 20.0000 mg | ORAL_CAPSULE | Freq: Two times a day (BID) | ORAL | Status: DC

## 2014-09-29 NOTE — Progress Notes (Signed)
   Subjective:    Patient ID: Vanessa Sutton, female    DOB: 03/21/1966, 49 y.o.   MRN: [id number]  HPI She is here for follow-up visit. She was recently given 20 mg of Adderall instead of 25 and found it to actually get her better control of her focus. It lasts roughly 4 or 5 hours. He states that usually later on in the afternoon she will "hit a wall". She also complains of intermittent difficulty over the last several months with ear itching and slight discomfort. She denies of and underlying allergies but states they only bother her in January.She does have a history of eczema.   Review of Systems     Objective:   Physical Exam Alert and in no distress. Tympanic membranes and canals are normal. Pharyngeal area is normal. Neck is supple without adenopathy or thyromegaly. Cardiac exam shows a regular sinus rhythm without murmurs or gallops. Lungs are clear to auscultation.        Assessment & Plan:  Eczema of external ear, bilateral  ADD (attention deficit disorder) - Plan: amphetamine-dextroamphetamine (ADDERALL XR) 20 MG 24 hr capsule, amphetamine-dextroamphetamine (ADDERALL XR) 20 MG 24 hr capsule, amphetamine-dextroamphetamine (ADDERALL XR) 20 MG 24 hr capsule  I will switch her to the 20 mg since it works. Also recommend cortisone cream for the eczema although be canals did appear normal.

## 2014-10-09 ENCOUNTER — Encounter (HOSPITAL_COMMUNITY): Payer: Self-pay | Admitting: Emergency Medicine

## 2014-10-09 ENCOUNTER — Emergency Department (HOSPITAL_COMMUNITY)
Admission: EM | Admit: 2014-10-09 | Discharge: 2014-10-09 | Disposition: A | Discharge status: Home / Self Care | Payer: Medicare Other | Attending: Emergency Medicine | Admitting: Emergency Medicine

## 2014-10-09 DIAGNOSIS — Z8711 Personal history of peptic ulcer disease: Secondary | ICD-10-CM | POA: Diagnosis not present

## 2014-10-09 DIAGNOSIS — Z8639 Personal history of other endocrine, nutritional and metabolic disease: Secondary | ICD-10-CM | POA: Diagnosis not present

## 2014-10-09 DIAGNOSIS — S61011A Laceration without foreign body of right thumb without damage to nail, initial encounter: Secondary | ICD-10-CM | POA: Insufficient documentation

## 2014-10-09 DIAGNOSIS — Z79899 Other long term (current) drug therapy: Secondary | ICD-10-CM | POA: Insufficient documentation

## 2014-10-09 DIAGNOSIS — Z720 Tobacco use: Secondary | ICD-10-CM | POA: Diagnosis not present

## 2014-10-09 DIAGNOSIS — Z8619 Personal history of other infectious and parasitic diseases: Secondary | ICD-10-CM | POA: Insufficient documentation

## 2014-10-09 DIAGNOSIS — Y9289 Other specified places as the place of occurrence of the external cause: Secondary | ICD-10-CM | POA: Insufficient documentation

## 2014-10-09 DIAGNOSIS — Z23 Encounter for immunization: Secondary | ICD-10-CM | POA: Diagnosis not present

## 2014-10-09 DIAGNOSIS — Z8679 Personal history of other diseases of the circulatory system: Secondary | ICD-10-CM | POA: Insufficient documentation

## 2014-10-09 DIAGNOSIS — Y990 Civilian activity done for income or pay: Secondary | ICD-10-CM | POA: Diagnosis not present

## 2014-10-09 DIAGNOSIS — W293XXA Contact with powered garden and outdoor hand tools and machinery, initial encounter: Secondary | ICD-10-CM | POA: Diagnosis not present

## 2014-10-09 DIAGNOSIS — F909 Attention-deficit hyperactivity disorder, unspecified type: Secondary | ICD-10-CM | POA: Diagnosis not present

## 2014-10-09 DIAGNOSIS — Z872 Personal history of diseases of the skin and subcutaneous tissue: Secondary | ICD-10-CM | POA: Insufficient documentation

## 2014-10-09 DIAGNOSIS — S6991XA Unspecified injury of right wrist, hand and finger(s), initial encounter: Secondary | ICD-10-CM | POA: Diagnosis present

## 2014-10-09 DIAGNOSIS — Y9389 Activity, other specified: Secondary | ICD-10-CM | POA: Diagnosis not present

## 2014-10-09 MED ORDER — BACITRACIN ZINC 500 UNIT/GM EX OINT
TOPICAL_OINTMENT | CUTANEOUS | Status: AC
  Filled 2014-10-09: qty 0.9

## 2014-10-09 MED ORDER — HYDROCODONE-ACETAMINOPHEN 5-325 MG PO TABS: 2.0000 | ORAL_TABLET | ORAL | Status: DC | PRN

## 2014-10-09 MED ORDER — LIDOCAINE HCL 2 % IJ SOLN
10.0000 mL | Freq: Once | INTRAMUSCULAR | Status: DC
  Filled 2014-10-09: qty 20

## 2014-10-09 MED ORDER — TETANUS-DIPHTH-ACELL PERTUSSIS 5-2.5-18.5 LF-MCG/0.5 IM SUSP
0.5000 mL | Freq: Once | INTRAMUSCULAR | Status: AC
  Administered 2014-10-09: 0.5 mL via INTRAMUSCULAR
  Filled 2014-10-09: qty 0.5

## 2014-10-09 MED ORDER — LIDOCAINE-EPINEPHRINE 2 %-1:100000 IJ SOLN
INTRAMUSCULAR | Status: AC
  Filled 2014-10-09: qty 1

## 2014-10-09 NOTE — ED Notes (Signed)
Pt arrived to Ed with a laceration on the right thumb .  Pt cut herself with a saw type landscape implement. Laceration is 1.5 cm located on the joint connecting thumb to hand.

## 2014-10-09 NOTE — Discharge Instructions (Signed)

## 2014-10-09 NOTE — ED Provider Notes (Signed)
CSN: [id number]     Arrival date & time 10/09/14  1902 History  This chart was scribed for a non-physician practitioner, Elson AreasLeslie K Desiraye Rolfson, PA-C working with Eber HongBrian Miller, MD by [place] Peace, ED Scribe. The patient was seen in [facility]. The patient's care was started at 7:58 PM.    Chief Complaint  Patient presents with  . Hand Injury      The history is provided by the patient. No language interpreter was used.  HPI Comments: Vanessa Sutton is a 49 y.o. female who presents to the Emergency Department complaining of laceration to right thumb that occurred around 1:00 PM when she was working on her garden and accidentally cut herself on a saw type instrument. Pt notes she has been applying pressure and elevating affected hand to reduce bleeding. She expresses concern that she believes she may have injured the tendon in her thumb. She further reports experiencing some weakness and numbness to affected thumb. Pt is current everyday smoker.     Past Medical History  Diagnosis Date  . ADHD (attention deficit hyperactivity disorder)   . Migraine headache   . Dyslipidemia   . PUD (peptic ulcer disease)   . Atopic dermatitis   . Smoker   . Genital herpes    Past Surgical History  Procedure Laterality Date  . Back surgery     History reviewed. No pertinent family history. History  Substance Use Topics  . Smoking status: Current Every Day Smoker -- 0.50 packs/day    Types: Cigarettes  . Smokeless tobacco: Not on file  . Alcohol Use: Yes   OB History    No data available     Review of Systems  Skin: Positive for wound.       Laceration to right thumb.  Neurological: Positive for weakness and numbness.  All other systems reviewed and are negative.     Allergies  Prednisone and Other  Home Medications   Prior to Admission medications   Medication Sig Start Date End Date Taking? Authorizing Provider  amphetamine-dextroamphetamine (ADDERALL XR) 20 MG 24 hr capsule Take 1  capsule (20 mg total) by mouth 2 (two) times daily. 09/29/14  Yes Ronnald NianJohn C Lalonde, MD  EPINEPHrine (EPIPEN) 0.3 mg/0.3 mL IJ SOAJ injection Inject 0.3 mLs (0.3 mg total) into the muscle once. 12/14/13  Yes Ronnald NianJohn C Lalonde, MD  erythromycin ethylsuccinate (EES) 400 MG tablet Take 1 tablet (400 mg total) by mouth 3 (three) times daily. Patient not taking: Reported on 09/29/2014 12/25/13   Ronnald NianJohn C Lalonde, MD   BP 114/66 mmHg  Pulse 103  Temp(Src) 97.4 F (36.3 C) (Oral)  Resp 16  Ht 5\' 1"  (1.549 m)  Wt 123 lb (55.792 kg)  BMI 23.25 kg/m2  SpO2 100%  LMP 10/02/2014 (Approximate) Physical Exam  Constitutional: She is oriented to person, place, and time. She appears well-developed and well-nourished. No distress.  HENT:  Head: Normocephalic and atraumatic.  Eyes: Conjunctivae and EOM are normal.  Neck: Neck supple. No tracheal deviation present.  Cardiovascular: Normal rate.   Pulmonary/Chest: Effort normal. No respiratory distress.  Musculoskeletal: Normal range of motion.  Right thumb- Full extension.  Neurological: She is alert and oriented to person, place, and time. No cranial nerve deficit.  Skin: Skin is warm and dry.  4mm v shaped laceration to dorsal right thumb.   Psychiatric: She has a normal mood and affect. Her behavior is normal.  Nursing note and vitals reviewed.   ED Course  LACERATION  REPAIR Date/Time: 10/09/2014 9:28 PM Performed by: Elson Areas Authorized by: Elson Areas Consent: Verbal consent not obtained. Risks and benefits: risks, benefits and alternatives were discussed Consent given by: patient Patient understanding: patient states understanding of the procedure being performed Patient identity confirmed: verbally with patient Laceration length: 0.8 cm Foreign bodies: no foreign bodies Tendon involvement: none Nerve involvement: none Vascular damage: no Anesthesia: local infiltration Patient sedated: no Preparation: Patient was prepped and draped in  the usual sterile fashion. Debridement: minimal Skin closure: 5-0 Prolene Number of sutures: 3 Technique: simple Approximation: loose Approximation difficulty: simple Patient tolerance: Patient tolerated the procedure well with no immediate complications   (including critical care time) Labs Review Labs Reviewed - No data to display  Imaging Review No results found.   EKG Interpretation None     Medications - No data to display  8:01 PM- Treatment plan was discussed with patient who verbalizes understanding and agrees.  Pt concerned about tendon laceration.  I counseled on possible tendon injury.  Pt is advised to follow up with Hand surgeon for evaluation and recheck next week. MDM   Final diagnoses:  Laceration of right thumb, initial encounter   Pt called Dr. Mina Marble.  He will see in office next week.  I personally performed the services in this documentation, which was scribed in my presence.  The recorded information has been reviewed and considered.   Barnet Pall.   Lonia Skinner [place], PA-C 10/09/14 2130  Eber Hong, MD 10/10/14 [phone]

## 2014-11-21 DIAGNOSIS — Z202 Contact with and (suspected) exposure to infections with a predominantly sexual mode of transmission: Secondary | ICD-10-CM | POA: Diagnosis not present

## 2014-11-21 DIAGNOSIS — R5383 Other fatigue: Secondary | ICD-10-CM | POA: Diagnosis not present

## 2014-11-21 DIAGNOSIS — R5382 Chronic fatigue, unspecified: Secondary | ICD-10-CM | POA: Diagnosis not present

## 2014-11-21 DIAGNOSIS — Z113 Encounter for screening for infections with a predominantly sexual mode of transmission: Secondary | ICD-10-CM | POA: Diagnosis not present

## 2014-11-21 DIAGNOSIS — K122 Cellulitis and abscess of mouth: Secondary | ICD-10-CM | POA: Diagnosis not present

## 2015-01-03 ENCOUNTER — Telehealth: Payer: Self-pay | Admitting: Family Medicine

## 2015-01-03 DIAGNOSIS — F988 Other specified behavioral and emotional disorders with onset usually occurring in childhood and adolescence: Secondary | ICD-10-CM

## 2015-01-03 MED ORDER — AMPHETAMINE-DEXTROAMPHET ER 20 MG PO CP24: 20.0000 mg | ORAL_CAPSULE | Freq: Two times a day (BID) | ORAL | Status: DC

## 2015-01-03 NOTE — Telephone Encounter (Signed)
Requesting refill on Adderall XR 20mg. Call [phone] when script ready for pick up °

## 2015-01-26 ENCOUNTER — Emergency Department (HOSPITAL_COMMUNITY)
Admission: EM | Admit: 2015-01-26 | Discharge: 2015-01-26 | Discharge status: Against Medical Advice | Payer: Medicare Other

## 2015-01-26 NOTE — ED Notes (Signed)
Per registration, pt left after cursing at them d/t long wait.  Pt waited for triage for ~59 minutes.

## 2015-01-27 DIAGNOSIS — H10213 Acute toxic conjunctivitis, bilateral: Secondary | ICD-10-CM | POA: Diagnosis not present

## 2015-01-28 ENCOUNTER — Telehealth: Payer: Self-pay

## 2015-01-28 DIAGNOSIS — F988 Other specified behavioral and emotional disorders with onset usually occurring in childhood and adolescence: Secondary | ICD-10-CM

## 2015-01-28 NOTE — Telephone Encounter (Signed)
Refill request for Adderall 20mg #60 

## 2015-01-31 MED ORDER — AMPHETAMINE-DEXTROAMPHET ER 20 MG PO CP24: 20.0000 mg | ORAL_CAPSULE | Freq: Two times a day (BID) | ORAL | Status: DC

## 2015-05-04 ENCOUNTER — Telehealth: Payer: Self-pay | Admitting: Family Medicine

## 2015-05-04 DIAGNOSIS — F988 Other specified behavioral and emotional disorders with onset usually occurring in childhood and adolescence: Secondary | ICD-10-CM

## 2015-05-04 NOTE — Telephone Encounter (Signed)
Requesting refill on Adderall XR 20mg . Call [phone] when script ready for pick up

## 2015-05-05 MED ORDER — AMPHETAMINE-DEXTROAMPHET ER 20 MG PO CP24: 20.0000 mg | ORAL_CAPSULE | Freq: Two times a day (BID) | ORAL | Status: DC

## 2015-06-17 ENCOUNTER — Telehealth: Payer: Self-pay | Admitting: Family Medicine

## 2015-06-17 NOTE — Telephone Encounter (Signed)
Received a sign request to send medical records to [employer]. Records faxed to [phone].

## 2015-06-30 DIAGNOSIS — Z0279 Encounter for issue of other medical certificate: Secondary | ICD-10-CM

## 2015-08-05 ENCOUNTER — Encounter: Payer: Self-pay | Admitting: Family Medicine

## 2015-08-05 ENCOUNTER — Ambulatory Visit (INDEPENDENT_AMBULATORY_CARE_PROVIDER_SITE_OTHER): Payer: Medicare Other | Admitting: Family Medicine

## 2015-08-05 VITALS — BP 130/90 | HR 85 | Ht 61.0 in | Wt 133.0 lb

## 2015-08-05 DIAGNOSIS — F909 Attention-deficit hyperactivity disorder, unspecified type: Secondary | ICD-10-CM

## 2015-08-05 DIAGNOSIS — F988 Other specified behavioral and emotional disorders with onset usually occurring in childhood and adolescence: Secondary | ICD-10-CM

## 2015-08-05 MED ORDER — AMPHETAMINE-DEXTROAMPHET ER 20 MG PO CP24: 20.0000 mg | ORAL_CAPSULE | Freq: Two times a day (BID) | ORAL | Status: DC

## 2015-08-05 NOTE — Patient Instructions (Signed)
Try black cohosh which is the over-the-counter medicine for menopausal symptoms

## 2015-08-05 NOTE — Progress Notes (Signed)
   Subjective:    Patient ID: Vanessa Sutton, female    DOB: 03/31/1966, 50 y.o.   MRN: [id number]  HPI Her for consult concerning her ADD. She notes that the ADD medication she is on now which is a generic is not as effective. She notes difficulty with anxiety, heart rate changes as well as blood pressure and malaise. She does note that she did have one Adderall that was a branded product and when she took that had none of these symptoms. She would like to have branded product only.   Review of Systems     Objective:   Physical Exam Alert and in no distress otherwise not examined       Assessment & Plan:  ADD (attention deficit disorder) - Plan: amphetamine-dextroamphetamine (ADDERALL XR) 20 MG 24 hr capsule, amphetamine-dextroamphetamine (ADDERALL XR) 20 MG 24 hr capsule, amphetamine-dextroamphetamine (ADDERALL XR) 20 MG 24 hr capsule prescriptions were hand written dispense as written. She will let me know she has any trouble with this.

## 2015-08-08 ENCOUNTER — Encounter: Payer: Medicare Other | Admitting: Family Medicine

## 2015-08-10 ENCOUNTER — Telehealth: Payer: Self-pay | Admitting: Family Medicine

## 2015-08-10 NOTE — Telephone Encounter (Signed)
Pt states Adderall requiring P.A. And it must be brand name.  States unable to take the generic due to side effects of Increased BP & Pulse, nausea and irritability and malaise.  She states she has Medicare & Susa SimmondsARP is her pharmacy benefits ID# [id number].  Also states her insurance is going to change again on 08/20/15. [facility]

## 2015-08-11 ENCOUNTER — Other Ambulatory Visit: Payer: Self-pay | Admitting: Family Medicine

## 2015-08-11 NOTE — Telephone Encounter (Signed)
P.A. BRAND ADDERALL approved til 05/20/16, pt informed and she is at pharmacy

## 2015-10-05 ENCOUNTER — Telehealth: Payer: Self-pay | Admitting: Family Medicine

## 2015-10-05 NOTE — Telephone Encounter (Signed)
Completed another P.A. ADDERALL XR with new insurance

## 2015-10-11 NOTE — Telephone Encounter (Signed)
P.A. Approved til (until further notice), pt informed

## 2015-10-20 ENCOUNTER — Encounter: Payer: Self-pay | Admitting: Gastroenterology

## 2015-10-24 ENCOUNTER — Encounter: Payer: Self-pay | Admitting: Gastroenterology

## 2015-10-24 ENCOUNTER — Other Ambulatory Visit (INDEPENDENT_AMBULATORY_CARE_PROVIDER_SITE_OTHER): Payer: Medicare Other

## 2015-10-24 ENCOUNTER — Ambulatory Visit (INDEPENDENT_AMBULATORY_CARE_PROVIDER_SITE_OTHER): Payer: Medicare Other | Admitting: Gastroenterology

## 2015-10-24 VITALS — BP 120/60 | HR 66 | Ht 62.0 in | Wt 128.0 lb

## 2015-10-24 DIAGNOSIS — R197 Diarrhea, unspecified: Secondary | ICD-10-CM | POA: Diagnosis not present

## 2015-10-24 DIAGNOSIS — K649 Unspecified hemorrhoids: Secondary | ICD-10-CM | POA: Diagnosis not present

## 2015-10-24 DIAGNOSIS — R6889 Other general symptoms and signs: Secondary | ICD-10-CM

## 2015-10-24 DIAGNOSIS — K648 Other hemorrhoids: Secondary | ICD-10-CM

## 2015-10-24 LAB — COMPREHENSIVE METABOLIC PANEL
ALK PHOS: 79 U/L (ref 39–117)
ALT: 235 U/L — ABNORMAL HIGH (ref 0–35)
AST: 340 U/L — ABNORMAL HIGH (ref 0–37)
Albumin: 4.8 g/dL (ref 3.5–5.2)
BUN: 7 mg/dL (ref 6–23)
CO2: 26 meq/L (ref 19–32)
Calcium: 10 mg/dL (ref 8.4–10.5)
Chloride: 106 mEq/L (ref 96–112)
Creatinine, Ser: 0.56 mg/dL (ref 0.40–1.20)
GFR: 122.97 mL/min (ref >60.00)
Glucose, Bld: 94 mg/dL (ref 70–99)
Potassium: 4.4 mEq/L (ref 3.5–5.1)
Sodium: 139 mEq/L (ref 135–145)
Total Bilirubin: 0.4 mg/dL (ref 0.2–1.2)
Total Protein: 7.4 g/dL (ref 6.0–8.3)

## 2015-10-24 LAB — IGA: IgA: 163 mg/dL (ref 68–378)

## 2015-10-24 NOTE — Patient Instructions (Signed)
Your physician has requested that you go to the basement for the following lab work before leaving today:  CMET, TTG, IGA   You have been scheduled to have an anorectal manometry at [facility] on 11/14/2015 at 8:00a,. Please arrive 30 minutes prior to your appointment time for registration (1st floor of the hospital-admissions).  Please make certain to use 1 Fleets enema 2 hours prior to coming for your appointment. You can purchase Fleets enemas from the laxative section at your drug store. You should not eat anything during the two hours prior to the procedure. You may take regular medications with small sips of water at least 2 hours prior to the study.  Anorectal manometry is a test performed to evaluate patients with constipation or fecal incontinence. This test measures the pressures of the anal sphincter muscles, the sensation in the rectum, and the neural reflexes that are needed for normal bowel movements.  THE PROCEDURE The test takes approximately 30 minutes to 1 hour. You will be asked to change into a hospital gown. A technician or nurse will explain the procedure to you, take a brief health history, and answer any questions you may have. The patient then lies on his or her left side. A small, flexible tube, about the size of a thermometer, with a balloon at the end is inserted into the rectum. The catheter is connected to a machine that measures the pressure. During the test, the small balloon attached to the catheter may be inflated in the rectum to assess the normal reflex pathways. The nurse or technician may also ask the person to squeeze, relax, and push at various times. The anal sphincter muscle pressures are measured during each of these maneuvers. To squeeze, the patient tightens the sphincter muscles as if trying to prevent anything from coming out. To push or bear down, the patient strains down as if trying to have a bowel movement.
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
## 2015-10-25 LAB — TISSUE TRANSGLUTAMINASE, IGA: TISSUE TRANSGLUTAMINASE AB, IGA: 1 U/mL (ref <4)

## 2015-10-28 ENCOUNTER — Ambulatory Visit (HOSPITAL_COMMUNITY)
Admission: RE | Admit: 2015-10-28 | Discharge: 2015-10-28 | Disposition: A | Discharge status: Home / Self Care | Payer: Medicare Other | Source: Ambulatory Visit | Attending: Gastroenterology | Admitting: Gastroenterology

## 2015-11-02 ENCOUNTER — Encounter (HOSPITAL_COMMUNITY): Admission: RE | Payer: Self-pay | Source: Ambulatory Visit

## 2015-11-02 SURGERY — MANOMETRY, ANORECTAL

## 2015-11-08 ENCOUNTER — Telehealth: Payer: Self-pay

## 2015-11-08 DIAGNOSIS — F988 Other specified behavioral and emotional disorders with onset usually occurring in childhood and adolescence: Secondary | ICD-10-CM

## 2015-11-08 MED ORDER — AMPHETAMINE-DEXTROAMPHET ER 20 MG PO CP24: 20.0000 mg | ORAL_CAPSULE | Freq: Two times a day (BID) | ORAL | Status: DC

## 2015-11-08 NOTE — Telephone Encounter (Signed)
Spoke with pt and she request refill of the Adderall XR. Pt states she is leaving out town from 11/10/2015 until 11/25/2015.  She states she will be out on 11/20/2015.  Can you print with fill date of 11/20/2015 or 11/21/2015?   Pt can be reached at [phone]

## 2015-11-14 ENCOUNTER — Ambulatory Visit (HOSPITAL_COMMUNITY): Admission: RE | Admit: 2015-11-14 | Payer: Medicare Other | Source: Ambulatory Visit | Admitting: Gastroenterology

## 2015-12-14 ENCOUNTER — Telehealth: Payer: Self-pay | Admitting: Family Medicine

## 2015-12-16 NOTE — Telephone Encounter (Signed)
err

## 2015-12-17 ENCOUNTER — Telehealth: Payer: Self-pay | Admitting: Family Medicine

## 2015-12-22 NOTE — Telephone Encounter (Signed)
Called pharmacy t# [phone] due to P.A. Already approved, and ins prefers name brand and reran and went thru ins.

## 2015-12-30 ENCOUNTER — Encounter: Payer: Self-pay | Admitting: Family Medicine

## 2015-12-30 ENCOUNTER — Ambulatory Visit (INDEPENDENT_AMBULATORY_CARE_PROVIDER_SITE_OTHER): Payer: Medicare Other | Admitting: Family Medicine

## 2015-12-30 ENCOUNTER — Other Ambulatory Visit: Payer: Self-pay | Admitting: Family Medicine

## 2015-12-30 VITALS — BP 140/90 | HR 84 | Temp 97.9°F | Wt 130.4 lb

## 2015-12-30 DIAGNOSIS — R945 Abnormal results of liver function studies: Secondary | ICD-10-CM

## 2015-12-30 DIAGNOSIS — R7989 Other specified abnormal findings of blood chemistry: Secondary | ICD-10-CM

## 2015-12-30 DIAGNOSIS — R509 Fever, unspecified: Secondary | ICD-10-CM

## 2015-12-30 DIAGNOSIS — Z79899 Other long term (current) drug therapy: Secondary | ICD-10-CM | POA: Diagnosis not present

## 2015-12-30 NOTE — Progress Notes (Signed)
Subjective:    Patient ID: Vanessa Sutton, female    DOB: 1966/02/13, 50 y.o.   MRN: [id number]  HPI Chief Complaint  Patient presents with  . Diarrhea    diarrhea and low grade- for 2 years   She is here with complaints of feeling tired and having an intermittent low grade fever for the past 2 years. She is requesting that I prescribe an antibiotic for her. States she felt "back to normal after 2 years of feeling sick and tired" after taking an antibiotic.  She states she had antibiotics left over at home and self medicated with a 5 day course of erythromycin 2 weeks ago because a friend told her he thought she "had a blood infection". She has a difficult time staying on topic during the visit.  Requests a "t cell count" and when I ask what this is, she cannot answer my question.  Also complains of diarrhea for past 2 years. Is being worked up by her GI for this. She is planning to follow up with GI.   Denies any new symptoms today.   She has history of alcohol, daily use. Denies having had alcohol today and states her last alcohol intake was last night. Denies drug use.   Denies headache, ear pain, sore throat, dizziness, chest pain, DOE, cough, abdominal pain, urinary symptoms.   Past Medical History:  Diagnosis Date  . ADHD (attention deficit hyperactivity disorder)   . Atopic dermatitis   . Dyslipidemia    caused by lyrica  . Genital herpes    history of  . H/O cold sores   . Migraine headache   . PUD (peptic ulcer disease)   . Smoker     Review of Systems Pertinent positives and negatives in the history of present illness.     Objective:   Physical Exam  Constitutional: She is oriented to person, place, and time. She appears well-developed and well-nourished. No distress.  HENT:  Right Ear: Tympanic membrane and ear canal normal.  Left Ear: Tympanic membrane and ear canal normal.  Nose: Nose normal. Right sinus exhibits no maxillary sinus tenderness and no  frontal sinus tenderness. Left sinus exhibits no maxillary sinus tenderness and no frontal sinus tenderness.  Mouth/Throat: Uvula is midline, oropharynx is clear and moist and mucous membranes are normal.  Eyes: Conjunctivae are normal.  Neck: Normal range of motion. Neck supple.  Cardiovascular: Normal rate, regular rhythm, normal heart sounds and normal pulses.   Pulmonary/Chest: Effort normal and breath sounds normal.  Abdominal: Soft. Normal appearance and bowel sounds are normal. There is no hepatosplenomegaly. There is no tenderness. There is no rigidity, no rebound, no guarding, no CVA tenderness, no tenderness at McBurney's point and negative Murphy's sign.  Lymphadenopathy:    She has no cervical adenopathy.  Neurological: She is alert and oriented to person, place, and time. She has normal strength. No cranial nerve deficit or sensory deficit. Gait normal.  Skin: Skin is warm and dry.   BP 140/90   Pulse 84   Temp 97.9 F (36.6 C) (Oral)   Wt 130 lb 6.4 oz (59.1 kg)   LMP 10/02/2014 (Approximate)   BMI 23.85 kg/m       Assessment & Plan:  Intermittent fever of unknown origin - Plan: CBC with Differential/Platelet, Comprehensive metabolic panel  Elevated LFTs - Plan: CBC with Differential/Platelet, Comprehensive metabolic panel, Ethanol  Dicussed that she does not have a fever today and no obvious signs of infection  on exam therefore prescribing antibiotics would be inappropriate and not warranted at this time. Dr. Susann GivensLalonde also spoke with patient. Plan to order labs and recheck LFTs since she has history of these being elevated in the past. She admits to drinking alcohol daily but adamant that she has not had any alcohol since last night. Will follow up pending labs

## 2015-12-31 LAB — CBC WITH DIFFERENTIAL/PLATELET
BASOS PCT: 1 %
Basophils Absolute: 54 cells/uL (ref 0–200)
EOS PCT: 2 %
Eosinophils Absolute: 108 cells/uL (ref 15–500)
HEMATOCRIT: 40.2 % (ref 35.0–45.0)
HEMOGLOBIN: 13.4 g/dL (ref 11.7–15.5)
LYMPHS PCT: 30 %
Lymphs Abs: 1620 cells/uL (ref 850–3900)
MCH: 34.1 pg — ABNORMAL HIGH (ref 27.0–33.0)
MCHC: 33.3 g/dL (ref 32.0–36.0)
MCV: 102.3 fL — AB (ref 80.0–100.0)
MONO ABS: 486 {cells}/uL (ref 200–950)
MPV: 10.4 fL (ref 7.5–12.5)
Monocytes Relative: 9 %
Neutro Abs: 3132 cells/uL (ref 1500–7800)
Neutrophils Relative %: 58 %
Platelets: 232 10*3/uL (ref 140–400)
RBC: 3.93 MIL/uL (ref 3.80–5.10)
RDW: 13.5 % (ref 11.0–15.0)
WBC: 5.4 10*3/uL (ref 4.0–10.5)

## 2015-12-31 LAB — COMPREHENSIVE METABOLIC PANEL
ALT: 275 U/L — ABNORMAL HIGH (ref 6–29)
AST: 457 U/L — ABNORMAL HIGH (ref 10–35)
Albumin: 5 g/dL (ref 3.6–5.1)
Alkaline Phosphatase: 80 U/L (ref 33–115)
BILIRUBIN TOTAL: 0.5 mg/dL (ref 0.2–1.2)
BUN: 7 mg/dL (ref 7–25)
CALCIUM: 10.4 mg/dL — AB (ref 8.6–10.2)
CO2: 24 mmol/L (ref 20–31)
Chloride: 100 mmol/L (ref 98–110)
Creat: 0.64 mg/dL (ref 0.50–1.10)
Glucose, Bld: 84 mg/dL (ref 65–99)
POTASSIUM: 4.2 mmol/L (ref 3.5–5.3)
Sodium: 137 mmol/L (ref 135–146)
Total Protein: 7.3 g/dL (ref 6.1–8.1)

## 2016-01-02 ENCOUNTER — Telehealth: Payer: Self-pay

## 2016-01-02 ENCOUNTER — Other Ambulatory Visit: Payer: Self-pay | Admitting: Family Medicine

## 2016-01-02 ENCOUNTER — Other Ambulatory Visit: Payer: Self-pay

## 2016-01-02 DIAGNOSIS — R945 Abnormal results of liver function studies: Principal | ICD-10-CM

## 2016-01-02 DIAGNOSIS — R197 Diarrhea, unspecified: Secondary | ICD-10-CM

## 2016-01-02 DIAGNOSIS — R7989 Other specified abnormal findings of blood chemistry: Secondary | ICD-10-CM

## 2016-01-02 LAB — HEPATITIS PANEL, ACUTE
HCV Ab: NEGATIVE
Hep A IgM: NONREACTIVE
Hep B C IgM: NONREACTIVE
Hepatitis B Surface Ag: NEGATIVE

## 2016-01-02 MED ORDER — CLARITHROMYCIN 500 MG PO TABS: 500.0000 mg | ORAL_TABLET | Freq: Two times a day (BID) | ORAL | 0 refills | Status: DC

## 2016-01-02 NOTE — Progress Notes (Signed)
Her enzymes continue to be elevated. She apparently has not had a workup concerning this. I will be checking hepatitis B and C. Will also get liver ultrasound Strongly encouraged her to avoid all alcohol consumption. I will also place her on erythromycin. She states that the previous dosing that she gave herself caused her to not have anymore diarrhea. I explained that this was not my choice and there are potential risks and side effects from erythromycin and she is willing to accept them.

## 2016-01-02 NOTE — Telephone Encounter (Signed)
Called pt to inform her of her U/S abdomin complete Aug 23 at 9 am arrive at 8:40 nothing to eat or drink after midnight [address] left word for word message on pt machine

## 2016-01-04 LAB — ETHANOL, CLINICAL
ALCOHOL, ETHYL (B): 0.283 g/dL — AB
Alcohol, Ethyl (B): 283 mg/dL — ABNORMAL HIGH

## 2016-01-11 ENCOUNTER — Other Ambulatory Visit: Payer: Medicare Other

## 2016-01-11 ENCOUNTER — Telehealth: Payer: Self-pay | Admitting: Internal Medicine

## 2016-01-11 NOTE — Telephone Encounter (Signed)
Patient was [facility] and doesn't look like she has gone. Her appointment was at 9:00am. Lorain ChildesFYI

## 2016-01-11 NOTE — Telephone Encounter (Signed)
Left message to call me back.

## 2016-01-11 NOTE — Telephone Encounter (Signed)
Call and find out why she didn't get the ultrasound

## 2016-02-10 ENCOUNTER — Other Ambulatory Visit: Payer: Self-pay | Admitting: Medical

## 2016-02-10 ENCOUNTER — Telehealth: Payer: Self-pay | Admitting: Family Medicine

## 2016-02-10 MED ORDER — AMPHETAMINE-DEXTROAMPHET ER 20 MG PO CP24: 20.0000 mg | ORAL_CAPSULE | Freq: Two times a day (BID) | ORAL | 0 refills | Status: DC

## 2016-02-10 NOTE — Telephone Encounter (Signed)
error 

## 2016-02-10 NOTE — Telephone Encounter (Signed)
Pt called and is requesting a refill on her adderall, informed her that dr Susann Givenslalonde was out of town, but would like at least a month supply and would like to pick this up today, pt can be reached at [phone].

## 2016-02-10 NOTE — Telephone Encounter (Signed)
rx ready 

## 2016-02-10 NOTE — Telephone Encounter (Signed)
Pt called and wanted to let you know that she she did not have the ultrasound because she to go out of town for a emergency and she is also trying to figure out how to pay for the ultrasound she is trying to see if she can get another insurance, so she help pay for the ultrasound, she just wanted to let you know why she did not go,

## 2016-02-10 NOTE — Telephone Encounter (Signed)
Called pt and left message that rx was ready

## 2016-03-14 ENCOUNTER — Telehealth: Payer: Self-pay | Admitting: Family Medicine

## 2016-03-14 NOTE — Telephone Encounter (Signed)
Let her know that I need to see her again because her liver enzymes were elevated and we need to follow-up on this. I can renew her medicines  then

## 2016-03-14 NOTE — Telephone Encounter (Signed)
Pt requesting refill on Adderall XR 20 mg. Call pt at [phone] when script is ready for pick up

## 2016-03-14 NOTE — Telephone Encounter (Signed)
Patient states she is going out of town and she will call back and make appointment

## 2016-04-20 ENCOUNTER — Telehealth: Payer: Self-pay | Admitting: Family Medicine

## 2016-04-20 ENCOUNTER — Other Ambulatory Visit: Payer: Self-pay | Admitting: Medical

## 2016-04-20 DIAGNOSIS — F909 Attention-deficit hyperactivity disorder, unspecified type: Secondary | ICD-10-CM

## 2016-04-20 MED ORDER — AMPHETAMINE-DEXTROAMPHET ER 20 MG PO CP24: 20.0000 mg | ORAL_CAPSULE | Freq: Two times a day (BID) | ORAL | 0 refills | Status: DC

## 2016-04-20 NOTE — Telephone Encounter (Signed)
done

## 2016-04-20 NOTE — Telephone Encounter (Signed)
Pt called and is requesting a refill on her adderall, pt made a [facility] appt for Apr 30 2016, informed pt that dr Susann Givenslalonde was out of the office today and would like to have this today, and does not want to wait till Monday, pt can be reached at [phone] when ready to be picked up,

## 2016-04-20 NOTE — Telephone Encounter (Signed)
Called but was not able to leave message.

## 2016-04-30 ENCOUNTER — Ambulatory Visit (INDEPENDENT_AMBULATORY_CARE_PROVIDER_SITE_OTHER): Payer: Medicare Other | Admitting: Family Medicine

## 2016-04-30 ENCOUNTER — Encounter: Payer: Self-pay | Admitting: Family Medicine

## 2016-04-30 VITALS — BP 122/82 | HR 103 | Ht 62.0 in | Wt 139.0 lb

## 2016-04-30 DIAGNOSIS — F909 Attention-deficit hyperactivity disorder, unspecified type: Secondary | ICD-10-CM

## 2016-04-30 DIAGNOSIS — R7989 Other specified abnormal findings of blood chemistry: Secondary | ICD-10-CM | POA: Diagnosis not present

## 2016-04-30 DIAGNOSIS — Z79899 Other long term (current) drug therapy: Secondary | ICD-10-CM | POA: Diagnosis not present

## 2016-04-30 DIAGNOSIS — Z1231 Encounter for screening mammogram for malignant neoplasm of breast: Secondary | ICD-10-CM | POA: Diagnosis not present

## 2016-04-30 DIAGNOSIS — G43709 Chronic migraine without aura, not intractable, without status migrainosus: Secondary | ICD-10-CM

## 2016-04-30 DIAGNOSIS — Z1239 Encounter for other screening for malignant neoplasm of breast: Secondary | ICD-10-CM

## 2016-04-30 DIAGNOSIS — Z23 Encounter for immunization: Secondary | ICD-10-CM | POA: Diagnosis not present

## 2016-04-30 DIAGNOSIS — F17200 Nicotine dependence, unspecified, uncomplicated: Secondary | ICD-10-CM | POA: Diagnosis not present

## 2016-04-30 DIAGNOSIS — R945 Abnormal results of liver function studies: Secondary | ICD-10-CM

## 2016-04-30 LAB — CBC WITH DIFFERENTIAL/PLATELET
BASOS PCT: 0 %
Basophils Absolute: 0 cells/uL (ref 0–200)
EOS PCT: 2 %
Eosinophils Absolute: 112 cells/uL (ref 15–500)
HCT: 41.7 % (ref 35.0–45.0)
Hemoglobin: 14 g/dL (ref 11.7–15.5)
LYMPHS PCT: 21 %
Lymphs Abs: 1176 cells/uL (ref 850–3900)
MCH: 34.4 pg — ABNORMAL HIGH (ref 27.0–33.0)
MCHC: 33.6 g/dL (ref 32.0–36.0)
MCV: 102.5 fL — ABNORMAL HIGH (ref 80.0–100.0)
MONOS PCT: 11 %
MPV: 11.1 fL (ref 7.5–12.5)
Monocytes Absolute: 616 cells/uL (ref 200–950)
Neutro Abs: 3696 cells/uL (ref 1500–7800)
Neutrophils Relative %: 66 %
PLATELETS: 250 10*3/uL (ref 140–400)
RBC: 4.07 MIL/uL (ref 3.80–5.10)
RDW: 13.3 % (ref 11.0–15.0)
WBC: 5.6 10*3/uL (ref 4.0–10.5)

## 2016-04-30 MED ORDER — AMPHETAMINE-DEXTROAMPHET ER 25 MG PO CP24: 25.0000 mg | ORAL_CAPSULE | ORAL | 0 refills | Status: DC

## 2016-04-30 NOTE — Progress Notes (Signed)
   Subjective:    Patient ID: Vanessa Sutton, female    DOB: 12/27/1965, 50 y.o.   MRN: [id number]  HPI She is here for medication management visit. On her last visit her enzymes were quite elevated as was her blood alcohol level. Since then she states that she has cut down significantly on her alcohol consumption. She states that she has had none in the last 10 days and now is intermittently drinking. She also has underlying ADD stating her Adderall XR lasts 5-6 hours. She did drop down to 20 mg because when she was on the higher dose she noted difficulty with sleep issues as well as weight. She would like to increase it slightly as she is having difficulty staying focused. She also does have a history of migraine headaches having 1 or 2 per week. She has tried various triptan's as well as preventative medications over the last years without success. She has seen Dr. Clarisse GougeLewit in the past for this. She does have history of smoking but states that usually is roughly 4 per day. She would like a EpiPen refill as she does have a very significant cat allergy. She has never had a mammogram.  Review of Systems     Objective:   Physical Exam Alert and in no distress. Tympanic membranes and canals are normal. Pharyngeal area is normal. Neck is supple without adenopathy or thyromegaly. Cardiac exam shows a regular sinus rhythm without murmurs or gallops. Lungs are clear to auscultation.        Assessment & Plan:  Need for prophylactic vaccination and inoculation against influenza - Plan: Flu Vaccine QUAD 36+ mos PF IM (Fluarix & Fluzone Quad PF)  Attention deficit hyperactivity disorder (ADHD), unspecified ADHD type - Plan: amphetamine-dextroamphetamine (ADDERALL XR) 25 MG 24 hr capsule  Elevated LFTs - Plan: CBC with Differential/Platelet, Comprehensive metabolic panel  Chronic migraine without aura without status migrainosus, not intractable  Current smoker  Encounter for long-term (current) use  of medications - Plan: CBC with Differential/Platelet, Comprehensive metabolic panel, Lipid panel  Screening for breast cancer - Plan: MM DIGITAL SCREENING BILATERAL I will try her on the 25 mg dosing of Adderall and see how she tolerates that. Also discussed her liver function tests. I will recheck that. Refer to [facility] start her in a migraine headache study. Kircher to quit smoking entirely especially in regard to her migraines.

## 2016-05-01 LAB — COMPREHENSIVE METABOLIC PANEL
ALK PHOS: 62 U/L (ref 33–115)
ALT: 57 U/L — AB (ref 6–29)
AST: 63 U/L — AB (ref 10–35)
Albumin: 4.8 g/dL (ref 3.6–5.1)
BILIRUBIN TOTAL: 0.4 mg/dL (ref 0.2–1.2)
BUN: 8 mg/dL (ref 7–25)
CO2: 24 mmol/L (ref 20–31)
Calcium: 10.8 mg/dL — ABNORMAL HIGH (ref 8.6–10.2)
Chloride: 102 mmol/L (ref 98–110)
Creat: 0.59 mg/dL (ref 0.50–1.10)
GLUCOSE: 102 mg/dL — AB (ref 65–99)
Potassium: 4.5 mmol/L (ref 3.5–5.3)
Sodium: 135 mmol/L (ref 135–146)
Total Protein: 7.2 g/dL (ref 6.1–8.1)

## 2016-05-01 LAB — LIPID PANEL
CHOL/HDL RATIO: 3 ratio (ref <5.0)
Cholesterol: 207 mg/dL — ABNORMAL HIGH (ref <200)
HDL: 68 mg/dL (ref >50)
LDL CALC: 120 mg/dL — AB (ref <100)
Triglycerides: 96 mg/dL (ref <150)
VLDL: 19 mg/dL (ref <30)

## 2016-05-02 ENCOUNTER — Other Ambulatory Visit: Payer: Self-pay

## 2016-05-02 MED ORDER — EPINEPHRINE 0.3 MG/0.3ML IJ SOAJ: 0.3000 mg | Freq: Once | INTRAMUSCULAR | 1 refills | Status: AC

## 2016-05-31 ENCOUNTER — Telehealth: Payer: Self-pay

## 2016-05-31 DIAGNOSIS — F909 Attention-deficit hyperactivity disorder, unspecified type: Secondary | ICD-10-CM

## 2016-05-31 MED ORDER — AMPHETAMINE-DEXTROAMPHET ER 25 MG PO CP24: 25.0000 mg | ORAL_CAPSULE | Freq: Two times a day (BID) | ORAL | 0 refills | Status: DC

## 2016-05-31 NOTE — Telephone Encounter (Signed)
Pt called to request refill of the Adderall XR 25mg . [phone]. Trixie Rude/RLB

## 2016-05-31 NOTE — Telephone Encounter (Signed)
Needs refill of Adderall XR 25mg  bid. Please call to let her know when script ready

## 2016-07-16 ENCOUNTER — Telehealth: Payer: Self-pay | Admitting: Family Medicine

## 2016-07-16 NOTE — Telephone Encounter (Signed)
Let her know that I can't really do that under the present circumstances.

## 2016-07-16 NOTE — Telephone Encounter (Signed)
Patient informed and verbalized understanding

## 2016-07-16 NOTE — Telephone Encounter (Signed)
appt made

## 2016-07-16 NOTE — Telephone Encounter (Signed)
Pt states pharmacist filled Adderall as generic by mistake but because she left the store she can't return it and states generic doesn't work for her at all.  Says the pharmacist says and has to have new Rx, but insurance won't pay for another unless you change the strength.   She is going out of town tomorrow driving to [place] and you have no openings today on your schedule.  She would like a new Rx for Adderall XR 30mg  written for 30 days

## 2016-07-16 NOTE — Telephone Encounter (Signed)
Pt states she can't go to [place] without her medication, because she was driving 12 hours, states she can postpone her trip until Thursday and come in for an appointment and see you and bring in the bottle of pills if you would be willing to rewrite a new prescription?  Please advise

## 2016-07-16 NOTE — Telephone Encounter (Signed)
Let's have her do that

## 2016-07-17 ENCOUNTER — Telehealth: Payer: Self-pay | Admitting: Family Medicine

## 2016-07-17 ENCOUNTER — Ambulatory Visit (INDEPENDENT_AMBULATORY_CARE_PROVIDER_SITE_OTHER): Payer: Medicare Other | Admitting: Family Medicine

## 2016-07-17 DIAGNOSIS — F909 Attention-deficit hyperactivity disorder, unspecified type: Secondary | ICD-10-CM

## 2016-07-17 MED ORDER — AMPHETAMINE-DEXTROAMPHET ER 30 MG PO CP24: 30.0000 mg | ORAL_CAPSULE | Freq: Two times a day (BID) | ORAL | 0 refills | Status: DC

## 2016-07-17 NOTE — Telephone Encounter (Signed)
P.A. Approved until further notice, left message for pt

## 2016-07-17 NOTE — Telephone Encounter (Signed)
Called [facility] to do P.A. ADDERALL XR over the phone t# [phone] & explained needed expedited review due to pt going out of town but was told it would be faster to do it by fax.  Completed faxed form & faxed

## 2016-07-17 NOTE — Progress Notes (Signed)
   Subjective:    Patient ID: Vanessa Sutton, female    DOB: 04/26/1966, 51 y.o.   MRN: [id number]  HPI She is here for medication management visit. She filled her last Adderall XR prescription and they gave her a generic product. She states that she could not even tell she was on it and has a trip planned to [place]. She needs to stay focused since she is driving. Normally the medicine lasts roughly 6 hours requiring her to take 2 per day. He has no problem was she is taking it and has no withdrawal symptoms other than becoming less focused.  Review of Systems     Objective:   Physical Exam alert and in no distress otherwise not examined      Assessment & Plan:  Attention deficit hyperactivity disorder (ADHD), unspecified ADHD type - Plan: amphetamine-dextroamphetamine (ADDERALL XR) 30 MG 24 hr capsule In order to get insurance to cover this, I will give her slightly higher dose which she has been on in the past. We will see how she responds to this and also wrote for d.a.w.

## 2016-07-30 ENCOUNTER — Telehealth: Payer: Self-pay | Admitting: Family Medicine

## 2016-07-30 DIAGNOSIS — F909 Attention-deficit hyperactivity disorder, unspecified type: Secondary | ICD-10-CM

## 2016-07-30 MED ORDER — AMPHETAMINE-DEXTROAMPHET ER 30 MG PO CP24: 30.0000 mg | ORAL_CAPSULE | ORAL | 0 refills | Status: DC

## 2016-07-30 MED ORDER — AMPHETAMINE-DEXTROAMPHET ER 30 MG PO CP24: 30.0000 mg | ORAL_CAPSULE | Freq: Two times a day (BID) | ORAL | 0 refills | Status: DC

## 2016-07-30 NOTE — Telephone Encounter (Signed)
Requesting refill on Adderall 30 MG twice a day. Call pt at [phone] when med is ready for pick up

## 2016-10-01 ENCOUNTER — Telehealth: Payer: Self-pay | Admitting: Family Medicine

## 2016-10-01 MED ORDER — AMPHETAMINE-DEXTROAMPHET ER 30 MG PO CP24: 30.0000 mg | ORAL_CAPSULE | Freq: Two times a day (BID) | ORAL | 0 refills | Status: DC

## 2016-10-01 NOTE — Telephone Encounter (Signed)
done

## 2016-10-01 NOTE — Telephone Encounter (Signed)
The prescription was written daily instead of twice a day.

## 2016-10-01 NOTE — Telephone Encounter (Signed)
Pt called and stated that her last rx for adderall xr was printed incorrectly. Other months are correct. Pt take 2 a day not one. She did not notice this error until she picked up the medication. Pharmacists states she will need another rx for quantity of 30 with the word "Error" they will also needs insurance phone number on rx so they can do a override. This will ensure she doesn't have to pay for another copay.

## 2016-10-02 ENCOUNTER — Telehealth: Payer: Self-pay | Admitting: Family Medicine

## 2016-10-02 NOTE — Telephone Encounter (Signed)
Called and left message that pt needs to schedule a medicare well visit with dr Susann Givenslalonde called once before and she stated that she would call us back

## 2016-10-04 ENCOUNTER — Telehealth: Payer: Self-pay | Admitting: Family Medicine

## 2016-10-04 NOTE — Telephone Encounter (Signed)
Called and left message, that pt needs a medicare well visit called the first time stated she would call back called 2 more times and left messages for her to call back

## 2016-10-29 ENCOUNTER — Telehealth: Payer: Self-pay | Admitting: Family Medicine

## 2016-10-29 DIAGNOSIS — F909 Attention-deficit hyperactivity disorder, unspecified type: Secondary | ICD-10-CM

## 2016-10-29 MED ORDER — AMPHETAMINE-DEXTROAMPHET ER 30 MG PO CP24: 30.0000 mg | ORAL_CAPSULE | Freq: Two times a day (BID) | ORAL | 0 refills | Status: DC

## 2016-10-29 NOTE — Telephone Encounter (Signed)
Pt called for refills of Adderall xr 30 mg she take 2 a day. She states that she only has one pill left. She states she is short because of the 31 days in month. She is requesting she can get it filled tomorrow. She states that usually she is not that close but she has been taking daily because she is putting her father in nursing care. Please call pt at [phone] when ready. She does have a CPE scheduled in August.

## 2016-12-17 ENCOUNTER — Telehealth: Payer: Self-pay | Admitting: Family Medicine

## 2016-12-17 NOTE — Telephone Encounter (Signed)
Pt states has pink eye, they are red, burning & itchy, little swollen.  Says has had it before and would like eye drop called in to [facility].

## 2016-12-17 NOTE — Telephone Encounter (Signed)
Have her make an appointment

## 2016-12-17 NOTE — Telephone Encounter (Signed)
Left message word for word  

## 2017-01-01 ENCOUNTER — Telehealth: Payer: Self-pay | Admitting: Family Medicine

## 2017-01-01 NOTE — Telephone Encounter (Signed)
Pt called for refills of adderall xr 30 mg TWICE a day. Pt informed that JCL not in. She will need refilled before he returns. Sending back to [facility] to be filled. Pt to be called at [phone] when ready.

## 2017-01-01 NOTE — Telephone Encounter (Signed)
She canceled her august physical .   Have her come in/reschedule.    I don't feel comfortable refilling this given last few notes in chart about refills and appt.

## 2017-01-02 ENCOUNTER — Other Ambulatory Visit: Payer: Self-pay | Admitting: Medical

## 2017-01-02 DIAGNOSIS — F909 Attention-deficit hyperactivity disorder, unspecified type: Secondary | ICD-10-CM

## 2017-01-02 MED ORDER — AMPHETAMINE-DEXTROAMPHET ER 30 MG PO CP24: 30.0000 mg | ORAL_CAPSULE | Freq: Two times a day (BID) | ORAL | 0 refills | Status: DC

## 2017-01-02 NOTE — Telephone Encounter (Signed)
Called but was not able to leave a message

## 2017-01-02 NOTE — Telephone Encounter (Signed)
Rx ready, make sure she comes for appt

## 2017-01-02 NOTE — Telephone Encounter (Signed)
She has already rescheduled for Sept she is asking for 30 days can you please refill her Adderall XR 30 mg Twice a day please advise

## 2017-01-15 ENCOUNTER — Ambulatory Visit: Payer: Medicare Other | Admitting: Family Medicine

## 2017-01-25 ENCOUNTER — Telehealth: Payer: Self-pay | Admitting: Family Medicine

## 2017-01-25 DIAGNOSIS — F909 Attention-deficit hyperactivity disorder, unspecified type: Secondary | ICD-10-CM

## 2017-01-25 MED ORDER — AMPHETAMINE-DEXTROAMPHET ER 30 MG PO CP24
30.0000 mg | ORAL_CAPSULE | Freq: Two times a day (BID) | ORAL | 0 refills | Status: DC
Start: 2017-03-01 — End: 2017-03-06

## 2017-01-25 MED ORDER — AMPHETAMINE-DEXTROAMPHET ER 30 MG PO CP24: 30.0000 mg | ORAL_CAPSULE | Freq: Two times a day (BID) | ORAL | 0 refills | Status: DC

## 2017-01-25 NOTE — Telephone Encounter (Signed)
Pt called for refills of Adderall XR 30 mg twice a day. She states that it is early not due until the 16th but she is going out of town. She would like to take with her to fill when gone. Pt can be reached at [phone].

## 2017-02-13 ENCOUNTER — Telehealth: Payer: Self-pay | Admitting: Family Medicine

## 2017-02-13 NOTE — Telephone Encounter (Signed)
Pt said her Oct. 2018 script for Adderall XR does not have the correct directions on it to show that she takes it twice a day so she will need another script for October. Pt will bring the October script back in the office. Also pt just noticed that she apparently had two August Adderall XR scripts because she has an August script now but she used an Adderall XR August script in August. She will bring that one back to our office as well unless Dr Susann Givens thinks the pharmacy will accept that one for October.

## 2017-02-13 NOTE — Telephone Encounter (Signed)
Left message for patient to bring in rx's tomorrow and Dr. Susann Givens will change for her.

## 2017-02-13 NOTE — Telephone Encounter (Signed)
have her bring all the prescriptions him tomorrow and I will change them

## 2017-02-14 ENCOUNTER — Ambulatory Visit: Payer: Medicare Other | Admitting: Family Medicine

## 2017-03-06 ENCOUNTER — Telehealth: Payer: Self-pay | Admitting: Family Medicine

## 2017-03-06 MED ORDER — AMPHETAMINE-DEXTROAMPHET ER 30 MG PO CP24: 30.0000 mg | ORAL_CAPSULE | Freq: Two times a day (BID) | ORAL | 0 refills | Status: DC

## 2017-03-06 NOTE — Telephone Encounter (Signed)
Patient returned the Mar 01, 2017 rx to correct to show # 60.  Script written for #30.  Corrected and gave written rx to pt. Patient also returned two old rx March 11,2018 and Dec 30, 2016.  Scan copy of return in chart.

## 2017-03-11 ENCOUNTER — Ambulatory Visit: Payer: Medicare Other | Admitting: Family Medicine

## 2017-05-01 ENCOUNTER — Telehealth: Payer: Self-pay | Admitting: Family Medicine

## 2017-05-01 DIAGNOSIS — F909 Attention-deficit hyperactivity disorder, unspecified type: Secondary | ICD-10-CM

## 2017-05-01 MED ORDER — AMPHETAMINE-DEXTROAMPHET ER 30 MG PO CP24: 30.0000 mg | ORAL_CAPSULE | Freq: Two times a day (BID) | ORAL | 0 refills | Status: DC

## 2017-05-01 NOTE — Telephone Encounter (Signed)
Pt needs refill Adderall XR 30mg  bid #60 send to new pharmacy [facility] [address] # [id number] [phone] [id number] and must be name brand

## 2017-05-01 NOTE — Telephone Encounter (Signed)
Call and let her know that we can now be prescribed this directly to her drugstore.  Call the drugstore and make sure they give her brand name and if we need to resend that I will.

## 2017-05-02 NOTE — Telephone Encounter (Signed)
Patient picked up brand name yesterday.

## 2017-05-15 ENCOUNTER — Ambulatory Visit: Payer: Medicare Other | Admitting: Family Medicine

## 2017-07-01 ENCOUNTER — Ambulatory Visit: Payer: Medicare Other | Admitting: Family Medicine

## 2017-08-07 ENCOUNTER — Telehealth: Payer: Self-pay | Admitting: Family Medicine

## 2017-08-07 DIAGNOSIS — F909 Attention-deficit hyperactivity disorder, unspecified type: Secondary | ICD-10-CM

## 2017-08-07 MED ORDER — AMPHETAMINE-DEXTROAMPHET ER 30 MG PO CP24: 30.0000 mg | ORAL_CAPSULE | Freq: Two times a day (BID) | ORAL | 0 refills | Status: DC

## 2017-08-07 NOTE — Telephone Encounter (Signed)
New Message   *STAT* If patient is at the pharmacy, call can be transferred to refill team.   1. Which medications need to be refilled? (please list name of each medication and dose if known)  Adderall 20 mg tablet twice daily  2. Which pharmacy/location (including street and city if local pharmacy) is medication to be sent to? [facility] [address] [place], [place]  3. Do they need a 30 day or 90 day supply?  30 day supply

## 2017-08-07 NOTE — Addendum Note (Signed)
Addended by: Ronnald NianLALONDE, Sheniece Ruggles C on: 08/07/2017 03:51 PM   Modules accepted: Orders

## 2017-08-07 NOTE — Telephone Encounter (Signed)
[facility] [address] is requesting a reill on pt adderall 20 mg. Please advise. Thanks [employer]

## 2017-08-19 ENCOUNTER — Ambulatory Visit: Payer: Medicare Other | Admitting: Family Medicine

## 2017-11-07 ENCOUNTER — Telehealth: Payer: Self-pay | Admitting: Family Medicine

## 2017-11-07 NOTE — Telephone Encounter (Signed)
Pt called to f/u on Adderall prior auth. She was told that she need to make an appointment. 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 Pt said she has left a few messages at our office asking for help on prior auth for med refill before she runs out. Pt is now out of Adderall and a little upset that she is being asked to come in for an appointment now after she has ran completely out of meds and after she had to made multiple calls.

## 2017-11-08 ENCOUNTER — Ambulatory Visit (INDEPENDENT_AMBULATORY_CARE_PROVIDER_SITE_OTHER): Payer: Medicare Other | Admitting: Family Medicine

## 2017-11-08 ENCOUNTER — Encounter: Payer: Self-pay | Admitting: Family Medicine

## 2017-11-08 VITALS — BP 110/80 | HR 131 | Temp 97.9°F | Wt 134.4 lb

## 2017-11-08 DIAGNOSIS — G43709 Chronic migraine without aura, not intractable, without status migrainosus: Secondary | ICD-10-CM | POA: Diagnosis not present

## 2017-11-08 DIAGNOSIS — Z1231 Encounter for screening mammogram for malignant neoplasm of breast: Secondary | ICD-10-CM

## 2017-11-08 DIAGNOSIS — F17200 Nicotine dependence, unspecified, uncomplicated: Secondary | ICD-10-CM | POA: Diagnosis not present

## 2017-11-08 DIAGNOSIS — Z1239 Encounter for other screening for malignant neoplasm of breast: Secondary | ICD-10-CM

## 2017-11-08 DIAGNOSIS — K279 Peptic ulcer, site unspecified, unspecified as acute or chronic, without hemorrhage or perforation: Secondary | ICD-10-CM

## 2017-11-08 DIAGNOSIS — F909 Attention-deficit hyperactivity disorder, unspecified type: Secondary | ICD-10-CM | POA: Diagnosis not present

## 2017-11-08 DIAGNOSIS — Z79899 Other long term (current) drug therapy: Secondary | ICD-10-CM

## 2017-11-08 LAB — CBC WITH DIFFERENTIAL/PLATELET
BASOS: 0 %
Basophils Absolute: 0 10*3/uL (ref 0.0–0.2)
EOS (ABSOLUTE): 0 10*3/uL (ref 0.0–0.4)
EOS: 1 %
HEMOGLOBIN: 15.9 g/dL (ref 11.1–15.9)
Hematocrit: 45.1 % (ref 34.0–46.6)
IMMATURE GRANS (ABS): 0 10*3/uL (ref 0.0–0.1)
Immature Granulocytes: 0 %
LYMPHS: 33 %
Lymphocytes Absolute: 2 10*3/uL (ref 0.7–3.1)
MCH: 35.3 pg — AB (ref 26.6–33.0)
MCHC: 35.3 g/dL (ref 31.5–35.7)
MCV: 100 fL — AB (ref 79–97)
MONOCYTES: 5 %
Monocytes Absolute: 0.3 10*3/uL (ref 0.1–0.9)
NEUTROS ABS: 3.8 10*3/uL (ref 1.4–7.0)
Neutrophils: 61 %
Platelets: 290 10*3/uL (ref 150–450)
RBC: 4.51 x10E6/uL (ref 3.77–5.28)
RDW: 13.5 % (ref 12.3–15.4)
WBC: 6.1 10*3/uL (ref 3.4–10.8)

## 2017-11-08 LAB — COMPREHENSIVE METABOLIC PANEL
A/G RATIO: 2 (ref 1.2–2.2)
ALBUMIN: 5.1 g/dL (ref 3.5–5.5)
ALT: 366 IU/L — ABNORMAL HIGH (ref 0–32)
AST: 403 IU/L — ABNORMAL HIGH (ref 0–40)
Alkaline Phosphatase: 92 IU/L (ref 39–117)
BILIRUBIN TOTAL: 0.2 mg/dL (ref 0.0–1.2)
BUN / CREAT RATIO: 12 (ref 9–23)
BUN: 8 mg/dL (ref 6–24)
CHLORIDE: 106 mmol/L (ref 96–106)
CO2: 17 mmol/L — ABNORMAL LOW (ref 20–29)
Calcium: 10.6 mg/dL — ABNORMAL HIGH (ref 8.7–10.2)
Creatinine, Ser: 0.65 mg/dL (ref 0.57–1.00)
GFR, EST AFRICAN AMERICAN: 121 mL/min/{1.73_m2} (ref >59)
GFR, EST NON AFRICAN AMERICAN: 105 mL/min/{1.73_m2} (ref >59)
Globulin, Total: 2.6 g/dL (ref 1.5–4.5)
Glucose: 99 mg/dL (ref 65–99)
POTASSIUM: 4.3 mmol/L (ref 3.5–5.2)
Sodium: 141 mmol/L (ref 134–144)
TOTAL PROTEIN: 7.7 g/dL (ref 6.0–8.5)

## 2017-11-08 LAB — LIPID PANEL
CHOL/HDL RATIO: 2.6 ratio (ref 0.0–4.4)
Cholesterol, Total: 193 mg/dL (ref 100–199)
HDL: 75 mg/dL (ref >39)
LDL Calculated: 96 mg/dL (ref 0–99)
Triglycerides: 112 mg/dL (ref 0–149)
VLDL CHOLESTEROL CAL: 22 mg/dL (ref 5–40)

## 2017-11-08 MED ORDER — AMPHETAMINE-DEXTROAMPHET ER 30 MG PO CP24: 30.0000 mg | ORAL_CAPSULE | Freq: Two times a day (BID) | ORAL | 0 refills | Status: DC

## 2017-11-08 NOTE — Progress Notes (Signed)
   Subjective:    Patient ID: Vanessa Sutton, female    DOB: 11/27/1965, 52 y.o.   MRN: [id number]  HPI She is here for an interval evaluation.  She has not been seen in quite some time.  She is on disability mainly because of her migraines.  Presently she is not seeing a neurologist.  She also has a previous history of ulcer disease from apparently the use of NSAIDs.  She does continue to smoke and at this point is not interested in quitting.  She does have underlying ADHD.  She states that the medicine lasts roughly 7 hours and she takes this twice per day.  It gives her great focus and when it wears off, she does note increased distractibility.  She is happy with this dosing regimen.  Drinking was reviewed with her.  She does occasionally overindulge.  Otherwise she has no particular concerns or complaints.  Health maintenance was also evaluated.   Review of Systems     Objective:   Physical Exam Alert and in no distress. Tympanic membranes and canals are normal. Pharyngeal area is normal. Neck is supple without adenopathy or thyromegaly. Cardiac exam shows a regular sinus rhythm without murmurs or gallops. Lungs are clear to auscultation.        Assessment & Plan:  Chronic migraine without aura without status migrainosus, not intractable - Plan: CBC with Differential/Platelet, Comprehensive metabolic panel  Current smoker - Plan: CBC with Differential/Platelet, Comprehensive metabolic panel, Lipid panel  Screening for breast cancer - Plan: MM DIGITAL SCREENING BILATERAL  Encounter for long-term (current) use of medications - Plan: CBC with Differential/Platelet, Comprehensive metabolic panel, Lipid panel  PUD (peptic ulcer disease)  Attention deficit hyperactivity disorder (ADHD), unspecified ADHD type - Plan: amphetamine-dextroamphetamine (ADDERALL XR) 30 MG 24 hr capsule, amphetamine-dextroamphetamine (ADDERALL XR) 30 MG 24 hr capsule, amphetamine-dextroamphetamine (ADDERALL XR)  30 MG 24 hr capsule She will follow-up with her gynecologist.  We will also set her up for mammogram.  Discussed smoking cessation with her.  We will also refer her for a research protocol for her underlying migraine.

## 2017-11-13 ENCOUNTER — Institutional Professional Consult (permissible substitution): Payer: Medicare Other | Admitting: Family Medicine

## 2017-11-20 ENCOUNTER — Institutional Professional Consult (permissible substitution): Payer: Medicare Other | Admitting: Family Medicine

## 2017-12-01 DIAGNOSIS — J3089 Other allergic rhinitis: Secondary | ICD-10-CM | POA: Diagnosis not present

## 2017-12-01 DIAGNOSIS — L089 Local infection of the skin and subcutaneous tissue, unspecified: Secondary | ICD-10-CM | POA: Diagnosis not present

## 2017-12-01 DIAGNOSIS — W57XXXA Bitten or stung by nonvenomous insect and other nonvenomous arthropods, initial encounter: Secondary | ICD-10-CM | POA: Diagnosis not present

## 2017-12-01 DIAGNOSIS — S80861A Insect bite (nonvenomous), right lower leg, initial encounter: Secondary | ICD-10-CM | POA: Diagnosis not present

## 2017-12-13 DIAGNOSIS — L089 Local infection of the skin and subcutaneous tissue, unspecified: Secondary | ICD-10-CM | POA: Diagnosis not present

## 2017-12-13 DIAGNOSIS — S80861D Insect bite (nonvenomous), right lower leg, subsequent encounter: Secondary | ICD-10-CM | POA: Diagnosis not present

## 2017-12-13 DIAGNOSIS — W57XXXD Bitten or stung by nonvenomous insect and other nonvenomous arthropods, subsequent encounter: Secondary | ICD-10-CM | POA: Diagnosis not present

## 2017-12-19 ENCOUNTER — Ambulatory Visit: Payer: Self-pay | Admitting: Family Medicine

## 2018-03-04 ENCOUNTER — Telehealth: Payer: Self-pay

## 2018-03-04 NOTE — Telephone Encounter (Signed)
the symptoms do not sound like the flu.  Sounds like an upper respiratory infection.  I recommend symptomatic care.

## 2018-03-04 NOTE — Telephone Encounter (Signed)
Patient called requesting Tamiflu be sent to her pharmacy. Asked who diagnosed her and she said her symptoms. Patient symptoms consist of sore throat, dry cough, vomited this morning, tem(does not have thermometer to check temp). Patient was informed to come in for an appt but she stated she does not feel like driving and she couldn't find anyone that would bring her. She stated she would call back if she feels worse. Please advise

## 2018-03-05 NOTE — Telephone Encounter (Signed)
Pt was left a message regarding the symptomatic care. Pt was also advised she could call and make an appt if she would like. KH

## 2018-03-19 ENCOUNTER — Telehealth: Payer: Self-pay

## 2018-03-19 NOTE — Telephone Encounter (Signed)
Called pt to advise of fall and depression screening needing to close. LVM for pt to call back to office . KH

## 2018-05-02 ENCOUNTER — Other Ambulatory Visit: Payer: Self-pay

## 2018-05-02 DIAGNOSIS — F909 Attention-deficit hyperactivity disorder, unspecified type: Secondary | ICD-10-CM

## 2018-05-02 MED ORDER — AMPHETAMINE-DEXTROAMPHET ER 30 MG PO CP24: 30.0000 mg | ORAL_CAPSULE | Freq: Two times a day (BID) | ORAL | 0 refills | Status: DC

## 2018-05-02 NOTE — Telephone Encounter (Signed)
Pt called back and states her insurance is changing in January and she will make an appointment then

## 2018-05-02 NOTE — Telephone Encounter (Signed)
Patient call to get a refill on the pending medication.

## 2018-06-23 ENCOUNTER — Telehealth: Payer: Self-pay

## 2018-06-23 DIAGNOSIS — F909 Attention-deficit hyperactivity disorder, unspecified type: Secondary | ICD-10-CM

## 2018-06-23 MED ORDER — AMPHETAMINE-DEXTROAMPHET ER 30 MG PO CP24: 30.0000 mg | ORAL_CAPSULE | Freq: Two times a day (BID) | ORAL | 0 refills | Status: DC

## 2018-06-23 NOTE — Telephone Encounter (Signed)
Patient wants her Adderall to go to [employer] for insurance purposes. Please cancel the 07/03/18 [facility]ill to [facility] and send to [facility]#[id number] [employer]. Prior Authorization needed    Fax number [phone].

## 2018-06-23 NOTE — Telephone Encounter (Signed)
Please route back to me after changing Adderall to new pharmacy

## 2018-06-23 NOTE — Telephone Encounter (Signed)
Take care of this 

## 2018-06-25 ENCOUNTER — Telehealth: Payer: Self-pay | Admitting: Family Medicine

## 2018-06-25 DIAGNOSIS — F909 Attention-deficit hyperactivity disorder, unspecified type: Secondary | ICD-10-CM

## 2018-06-25 MED ORDER — AMPHETAMINE-DEXTROAMPHET ER 30 MG PO CP24: 30.0000 mg | ORAL_CAPSULE | Freq: Two times a day (BID) | ORAL | 0 refills | Status: DC

## 2018-06-25 NOTE — Telephone Encounter (Signed)
P.A. BRAND ADDERALL XR completed & approved til 05/21/19.  Also spoke with pt and she was confused, she thought she had to use the mail order pharmacy & was concerned about the medicine bein gin her mail box and she not being home and the weather and something happening to it I advised this medication is not one that is easily replaceable and so we decided it was best to continue her picking it up at the local pharmacy.  [facility] called [facility] and she still has 2 refills there for Feb and March.  I called and cancelled the mail order.

## 2018-09-26 ENCOUNTER — Telehealth: Payer: Self-pay | Admitting: Family Medicine

## 2018-09-26 DIAGNOSIS — F909 Attention-deficit hyperactivity disorder, unspecified type: Secondary | ICD-10-CM

## 2018-09-26 MED ORDER — AMPHETAMINE-DEXTROAMPHET ER 30 MG PO CP24: 30.0000 mg | ORAL_CAPSULE | Freq: Two times a day (BID) | ORAL | 0 refills | Status: DC

## 2018-09-26 NOTE — Telephone Encounter (Signed)
Pt needs refill for her adderall pt needs It to go to [facility] [address], [place] - [address] Pt does have a appt  12/17/2018 for cpe,

## 2018-10-27 ENCOUNTER — Telehealth: Payer: Self-pay | Admitting: Family Medicine

## 2018-10-27 DIAGNOSIS — F909 Attention-deficit hyperactivity disorder, unspecified type: Secondary | ICD-10-CM

## 2018-10-27 MED ORDER — AMPHETAMINE-DEXTROAMPHET ER 30 MG PO CP24: 30.0000 mg | ORAL_CAPSULE | Freq: Two times a day (BID) | ORAL | 0 refills | Status: DC

## 2018-10-27 MED ORDER — AMPHETAMINE-DEXTROAMPHET ER 30 MG PO CP24: 30.0000 mg | ORAL_CAPSULE | Freq: Every day | ORAL | 0 refills | Status: DC

## 2018-10-27 NOTE — Telephone Encounter (Signed)
Pharmacy states that adderall needs BID not take 1 capsule by mouth daily please resend pt uses [place], [place]

## 2018-10-27 NOTE — Telephone Encounter (Signed)
Pt called for adderall 30 mg xr bid refill to [facility].

## 2018-11-14 ENCOUNTER — Telehealth: Payer: Self-pay

## 2018-11-14 DIAGNOSIS — Z6824 Body mass index (BMI) 24.0-24.9, adult: Secondary | ICD-10-CM | POA: Diagnosis not present

## 2018-11-14 DIAGNOSIS — S9032XA Contusion of left foot, initial encounter: Secondary | ICD-10-CM | POA: Diagnosis not present

## 2018-11-14 NOTE — Telephone Encounter (Signed)
Pt called and has injury to her foot. Pt was advised to go to  an urgent care and have X ray done [facility]

## 2018-12-09 ENCOUNTER — Telehealth: Payer: Self-pay

## 2018-12-09 NOTE — Telephone Encounter (Signed)
Called pt to advise pt needs complete a covid screening and to be advised of new check process. [place]

## 2018-12-17 ENCOUNTER — Encounter: Payer: Self-pay | Admitting: Family Medicine

## 2018-12-17 ENCOUNTER — Other Ambulatory Visit: Payer: Self-pay

## 2018-12-17 ENCOUNTER — Ambulatory Visit (INDEPENDENT_AMBULATORY_CARE_PROVIDER_SITE_OTHER): Payer: Medicare HMO | Admitting: Family Medicine

## 2018-12-17 VITALS — BP 128/81 | Wt 132.0 lb

## 2018-12-17 DIAGNOSIS — M5481 Occipital neuralgia: Secondary | ICD-10-CM

## 2018-12-17 DIAGNOSIS — F909 Attention-deficit hyperactivity disorder, unspecified type: Secondary | ICD-10-CM | POA: Diagnosis not present

## 2018-12-17 DIAGNOSIS — F17200 Nicotine dependence, unspecified, uncomplicated: Secondary | ICD-10-CM

## 2018-12-17 DIAGNOSIS — Z79899 Other long term (current) drug therapy: Secondary | ICD-10-CM | POA: Diagnosis not present

## 2018-12-17 DIAGNOSIS — F419 Anxiety disorder, unspecified: Secondary | ICD-10-CM

## 2018-12-17 DIAGNOSIS — Z1211 Encounter for screening for malignant neoplasm of colon: Secondary | ICD-10-CM

## 2018-12-17 MED ORDER — FROVATRIPTAN SUCCINATE 2.5 MG PO TABS: 2.5000 mg | ORAL_TABLET | ORAL | 0 refills | Status: DC | PRN

## 2018-12-17 MED ORDER — AMPHETAMINE-DEXTROAMPHET ER 30 MG PO CP24: 30.0000 mg | ORAL_CAPSULE | Freq: Two times a day (BID) | ORAL | 0 refills | Status: DC

## 2018-12-17 MED ORDER — CLONAZEPAM 0.5 MG PO TABS: 0.5000 mg | ORAL_TABLET | Freq: Two times a day (BID) | ORAL | 0 refills | Status: DC | PRN

## 2018-12-17 MED ORDER — ONDANSETRON HCL 4 MG PO TABS: 4.0000 mg | ORAL_TABLET | Freq: Three times a day (TID) | ORAL | 0 refills | Status: DC | PRN

## 2018-12-17 MED ORDER — OXYCODONE-ACETAMINOPHEN 7.5-325 MG PO TABS: 1.0000 | ORAL_TABLET | ORAL | 0 refills | Status: DC | PRN
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
## 2019-02-26 ENCOUNTER — Encounter: Payer: Self-pay | Admitting: Family Medicine

## 2019-02-26 ENCOUNTER — Ambulatory Visit (INDEPENDENT_AMBULATORY_CARE_PROVIDER_SITE_OTHER): Payer: Medicare HMO | Admitting: Family Medicine

## 2019-02-26 ENCOUNTER — Other Ambulatory Visit: Payer: Self-pay

## 2019-02-26 VITALS — BP 132/70 | HR 88 | Temp 99.3°F | Wt 132.0 lb

## 2019-02-26 DIAGNOSIS — H9202 Otalgia, left ear: Secondary | ICD-10-CM

## 2019-02-26 DIAGNOSIS — J0110 Acute frontal sinusitis, unspecified: Secondary | ICD-10-CM

## 2019-02-26 DIAGNOSIS — F17200 Nicotine dependence, unspecified, uncomplicated: Secondary | ICD-10-CM | POA: Diagnosis not present

## 2019-02-26 MED ORDER — AMOXICILLIN-POT CLAVULANATE 875-125 MG PO TABS: 1.0000 | ORAL_TABLET | Freq: Two times a day (BID) | ORAL | 0 refills | Status: DC

## 2019-02-26 NOTE — Progress Notes (Signed)
   Subjective:    Patient ID: Vanessa Sutton, female    DOB: 09-10-1965, 53 y.o.   MRN: [id number]  HPI Documentation for virtual telephone encounter. Documentation for virtual audio and video telecommunications through [place] encounter: The patient was located at home. The provider was located in the office. The patient did consent to this visit and is aware of possible charges through their insurance for this visit. The other persons participating in this telemedicine service were none. This virtual service is not related to other E/M service within previous 7 days. She complains of a 2-week history of left-sided earache with some sinus pressure and subsequent postnasal drainage that has become purulent as well as fatigue and intermittently productive cough.  No fever, chills, sore throat.  She does smoke.   Review of Systems     Objective:   Physical Exam Alert and in no distress otherwise not examined       Assessment & Plan:  Current smoker  Acute frontal sinusitis, recurrence not specified - Plan: amoxicillin-clavulanate (AUGMENTIN) 875-125 MG tablet  Earache on left - Plan: amoxicillin-clavulanate (AUGMENTIN) 875-125 MG tablet Symptoms are consistent with a left otitis media as well as sinusitis.  I will treat her with Augmentin.  She is to call if not entirely better when she finishes the antibiotic.  Also discussed smoking cessation but at this point she is not interested in quitting.

## 2019-03-04 ENCOUNTER — Telehealth: Payer: Self-pay

## 2019-03-04 NOTE — Telephone Encounter (Signed)
Called pt to advise [employer]  Needs her sample to process. No answer lvm. [place]

## 2019-03-16 DIAGNOSIS — Z7189 Other specified counseling: Secondary | ICD-10-CM | POA: Diagnosis not present

## 2019-03-16 DIAGNOSIS — R05 Cough: Secondary | ICD-10-CM | POA: Diagnosis not present

## 2019-03-16 DIAGNOSIS — Z20828 Contact with and (suspected) exposure to other viral communicable diseases: Secondary | ICD-10-CM | POA: Diagnosis not present

## 2019-03-18 DIAGNOSIS — Z1211 Encounter for screening for malignant neoplasm of colon: Secondary | ICD-10-CM | POA: Diagnosis not present

## 2019-03-25 LAB — COLOGUARD: Cologuard: NEGATIVE

## 2019-03-26 ENCOUNTER — Telehealth: Payer: Self-pay

## 2019-03-26 NOTE — Telephone Encounter (Signed)
Called pt back to advise of negative cologuard results . Vanessa Sutton

## 2019-03-26 NOTE — Progress Notes (Signed)
LVM for pt to call back for cologuard results . Negative. [place]

## 2019-04-20 ENCOUNTER — Other Ambulatory Visit: Payer: Self-pay | Admitting: Family Medicine

## 2019-04-20 DIAGNOSIS — Z1231 Encounter for screening mammogram for malignant neoplasm of breast: Secondary | ICD-10-CM

## 2019-05-12 DIAGNOSIS — Z20828 Contact with and (suspected) exposure to other viral communicable diseases: Secondary | ICD-10-CM | POA: Diagnosis not present

## 2019-06-04 ENCOUNTER — Encounter: Payer: Self-pay | Admitting: Family Medicine

## 2019-06-05 ENCOUNTER — Telehealth: Payer: Self-pay | Admitting: Family Medicine

## 2019-06-05 DIAGNOSIS — F909 Attention-deficit hyperactivity disorder, unspecified type: Secondary | ICD-10-CM

## 2019-06-05 DIAGNOSIS — M5481 Occipital neuralgia: Secondary | ICD-10-CM

## 2019-06-05 MED ORDER — AMPHETAMINE-DEXTROAMPHET ER 30 MG PO CP24: 30.0000 mg | ORAL_CAPSULE | Freq: Two times a day (BID) | ORAL | 0 refills | Status: DC

## 2019-06-05 MED ORDER — OXYCODONE-ACETAMINOPHEN 7.5-325 MG PO TABS: 1.0000 | ORAL_TABLET | ORAL | 0 refills | Status: DC | PRN

## 2019-06-05 MED ORDER — ONDANSETRON HCL 4 MG PO TABS: 4.0000 mg | ORAL_TABLET | Freq: Three times a day (TID) | ORAL | 0 refills | Status: DC | PRN

## 2019-06-05 MED ORDER — AMPHETAMINE-DEXTROAMPHET ER 30 MG PO CP24: 60.0000 mg | ORAL_CAPSULE | Freq: Two times a day (BID) | ORAL | 0 refills | Status: DC

## 2019-06-05 NOTE — Telephone Encounter (Signed)
Pt needs refill on her Adderall,Ondansetron, and Oxycodone sent to the [facility] on [address]

## 2019-06-08 ENCOUNTER — Telehealth: Payer: Self-pay

## 2019-06-08 DIAGNOSIS — F909 Attention-deficit hyperactivity disorder, unspecified type: Secondary | ICD-10-CM

## 2019-06-08 MED ORDER — AMPHETAMINE-DEXTROAMPHET ER 30 MG PO CP24: 30.0000 mg | ORAL_CAPSULE | Freq: Two times a day (BID) | ORAL | 0 refills | Status: DC

## 2019-06-08 NOTE — Telephone Encounter (Signed)
Pt script was different for her adderall for the month of Nov. Pt is taking one tablet BID and it need to be changed. Thanks . KH

## 2019-06-08 NOTE — Telephone Encounter (Signed)
A prescription for January and March was correct.  I corrected the one for February.  That should take care of everything.  Let her know that.

## 2019-06-08 NOTE — Addendum Note (Signed)
Addended by: Ronnald Nian on: 06/08/2019 01:16 PM   Modules accepted: Orders

## 2019-06-10 ENCOUNTER — Ambulatory Visit: Payer: Medicare Other

## 2019-06-16 ENCOUNTER — Telehealth: Payer: Self-pay

## 2019-06-16 NOTE — Telephone Encounter (Signed)
I submitted a PA for the pts. Adderall medication and it was approved form 05/22/19-05/20/20.

## 2019-07-24 ENCOUNTER — Telehealth: Payer: Self-pay | Admitting: Family Medicine

## 2019-07-24 NOTE — Telephone Encounter (Signed)
Have her make an appointment.  There is not good documentation as to why she is getting this.

## 2019-07-24 NOTE — Telephone Encounter (Signed)
Pt called and is requesting a refill on her oxycodone pt would like it sent to the [facility] [address] [place] - [address]

## 2019-07-27 NOTE — Telephone Encounter (Signed)
Virtual appt made due to pt being out of town. KH

## 2019-07-28 ENCOUNTER — Other Ambulatory Visit: Payer: Self-pay

## 2019-07-28 ENCOUNTER — Encounter: Payer: Self-pay | Admitting: Family Medicine

## 2019-07-28 ENCOUNTER — Ambulatory Visit (INDEPENDENT_AMBULATORY_CARE_PROVIDER_SITE_OTHER): Payer: Medicare HMO | Admitting: Family Medicine

## 2019-07-28 VITALS — Temp 98.6°F | Wt 137.0 lb

## 2019-07-28 DIAGNOSIS — M5481 Occipital neuralgia: Secondary | ICD-10-CM

## 2019-07-28 MED ORDER — OXYCODONE-ACETAMINOPHEN 7.5-325 MG PO TABS: 1.0000 | ORAL_TABLET | ORAL | 0 refills | Status: DC | PRN

## 2019-07-28 NOTE — Progress Notes (Signed)
   Subjective:    Patient ID: Vanessa Sutton, female    DOB: 11/30/1965, 54 y.o.   MRN: [id number]  HPI Documentation for virtual telephone encounter. Documentation for virtual audio and video telecommunications through [place] encounter: The patient was located at home. The provider was located in the office. The patient did consent to this visit and is aware of possible charges through their insurance for this visit. The other persons participating in this telemedicine service were none. Time spent on call was 10 minutesThis virtual service is not related to other E/M service within previous 7 days. She apparently has a history of occipital neuralgia and has seen Dr. Adine Madura it in the past for that.  She was referred to [facility] neuro in September but apparently did not keep the appointment due to Covid concerns.  Recently she has asked for refill on her oxycodone.  I did give her 1 in January but wanted to discuss this further with her.  Apparently this time of year does cause more difficulty with that.   Review of Systems     Objective:   Physical Exam Alert and in no distress otherwise not examined       Assessment & Plan:  Occipital neuralgia, unspecified laterality - Plan: oxyCODONE-acetaminophen (PERCOCET) 7.5-325 MG tablet, Ambulatory referral to Neurology She voiced reluctance to going to neurology because of concerns over Covid.  I explained that I cannot continue to give her this medication without further neurologic evaluation.

## 2019-08-18 DIAGNOSIS — S92911A Unspecified fracture of right toe(s), initial encounter for closed fracture: Secondary | ICD-10-CM | POA: Diagnosis not present

## 2019-08-18 DIAGNOSIS — M79671 Pain in right foot: Secondary | ICD-10-CM | POA: Diagnosis not present

## 2019-09-28 ENCOUNTER — Ambulatory Visit: Payer: Medicare HMO | Admitting: Neurology

## 2019-10-22 ENCOUNTER — Telehealth: Payer: Self-pay | Admitting: Family Medicine

## 2019-10-22 DIAGNOSIS — F909 Attention-deficit hyperactivity disorder, unspecified type: Secondary | ICD-10-CM

## 2019-10-22 MED ORDER — AMPHETAMINE-DEXTROAMPHET ER 30 MG PO CP24: 30.0000 mg | ORAL_CAPSULE | Freq: Two times a day (BID) | ORAL | 0 refills | Status: DC

## 2019-10-22 NOTE — Telephone Encounter (Signed)
Pt called and needs refill on Adderall XR sent to the [facility] on [address]

## 2019-10-29 ENCOUNTER — Ambulatory Visit: Payer: Medicare HMO | Admitting: Neurology

## 2019-12-23 ENCOUNTER — Ambulatory Visit: Payer: Medicare HMO | Admitting: Neurology

## 2019-12-28 ENCOUNTER — Encounter: Payer: Self-pay | Admitting: Family Medicine

## 2020-02-25 ENCOUNTER — Ambulatory Visit: Payer: Medicare HMO | Admitting: Neurology

## 2020-03-01 ENCOUNTER — Telehealth: Payer: Self-pay | Admitting: Family Medicine

## 2020-03-01 NOTE — Telephone Encounter (Signed)
I see nothing in there from [facility] neurology.  See if you can find out what happened over their first

## 2020-03-01 NOTE — Telephone Encounter (Signed)
[facility] refused to see pt advising there was nothing else that they could offer her . They stated that pt advise them that she would be ok with seeing [facility]. So referral was sent over and they have advise they are not accepting new pt at this time. Please advise [facility]

## 2020-03-01 NOTE — Telephone Encounter (Signed)
Pt called and states that she needs a referral to go to the [facility] neurology states that [facility] was unable to see her, she needs to go for her Occipitial Neuralgia  Pt can be reached at [phone]

## 2020-03-02 NOTE — Telephone Encounter (Signed)
Virtual appointment  

## 2020-03-03 NOTE — Telephone Encounter (Signed)
LVM for pt to call back for a virtual visit. KH 

## 2020-03-18 ENCOUNTER — Telehealth: Payer: Self-pay

## 2020-03-18 NOTE — Telephone Encounter (Signed)
Just sending you a reminder that Vanessa Sutton brought in a lot of medical records for you to review and you told me to put them up front. They are up front on the silver cart by the door when ever you are ready to review them.

## 2020-03-22 DIAGNOSIS — H52223 Regular astigmatism, bilateral: Secondary | ICD-10-CM | POA: Diagnosis not present

## 2020-03-22 DIAGNOSIS — R6889 Other general symptoms and signs: Secondary | ICD-10-CM | POA: Diagnosis not present

## 2020-03-23 ENCOUNTER — Encounter: Payer: Self-pay | Admitting: Family Medicine

## 2020-03-23 ENCOUNTER — Telehealth (INDEPENDENT_AMBULATORY_CARE_PROVIDER_SITE_OTHER): Payer: Medicare HMO | Admitting: Family Medicine

## 2020-03-23 ENCOUNTER — Other Ambulatory Visit: Payer: Self-pay

## 2020-03-23 VITALS — Ht 61.0 in | Wt 137.0 lb

## 2020-03-23 DIAGNOSIS — Z560 Unemployment, unspecified: Secondary | ICD-10-CM | POA: Diagnosis not present

## 2020-03-23 DIAGNOSIS — M5481 Occipital neuralgia: Secondary | ICD-10-CM

## 2020-03-23 DIAGNOSIS — R519 Headache, unspecified: Secondary | ICD-10-CM

## 2020-03-23 DIAGNOSIS — H9202 Otalgia, left ear: Secondary | ICD-10-CM | POA: Diagnosis not present

## 2020-03-23 DIAGNOSIS — F909 Attention-deficit hyperactivity disorder, unspecified type: Secondary | ICD-10-CM

## 2020-03-23 DIAGNOSIS — J0110 Acute frontal sinusitis, unspecified: Secondary | ICD-10-CM | POA: Diagnosis not present

## 2020-03-23 MED ORDER — AMOXICILLIN-POT CLAVULANATE 875-125 MG PO TABS: 1.0000 | ORAL_TABLET | Freq: Two times a day (BID) | ORAL | 0 refills | Status: DC

## 2020-03-23 MED ORDER — AMPHETAMINE-DEXTROAMPHET ER 20 MG PO CP24: 20.0000 mg | ORAL_CAPSULE | ORAL | 0 refills | Status: DC

## 2020-03-23 MED ORDER — OXYCODONE-ACETAMINOPHEN 7.5-325 MG PO TABS: 1.0000 | ORAL_TABLET | ORAL | 0 refills | Status: DC | PRN
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
## 2020-04-06 ENCOUNTER — Other Ambulatory Visit: Payer: Self-pay | Admitting: Family Medicine

## 2020-04-06 DIAGNOSIS — H9202 Otalgia, left ear: Secondary | ICD-10-CM

## 2020-04-06 DIAGNOSIS — J0110 Acute frontal sinusitis, unspecified: Secondary | ICD-10-CM

## 2020-04-06 NOTE — Telephone Encounter (Signed)
[facility]. Please advise [facility]

## 2020-04-25 ENCOUNTER — Ambulatory Visit: Payer: Medicare HMO | Admitting: Neurology

## 2020-05-17 ENCOUNTER — Telehealth: Payer: Self-pay | Admitting: Family Medicine

## 2020-05-17 DIAGNOSIS — F909 Attention-deficit hyperactivity disorder, unspecified type: Secondary | ICD-10-CM

## 2020-05-17 MED ORDER — AMPHETAMINE-DEXTROAMPHET ER 30 MG PO CP24: 30.0000 mg | ORAL_CAPSULE | ORAL | 0 refills | Status: DC

## 2020-05-17 MED ORDER — AMPHETAMINE-DEXTROAMPHET ER 20 MG PO CP24: 20.0000 mg | ORAL_CAPSULE | ORAL | 0 refills | Status: DC

## 2020-05-17 NOTE — Telephone Encounter (Signed)
PT CAlled and is requesting a refill for her adderall xr please send to the [facility] [address] [place] - [address]

## 2020-05-17 NOTE — Telephone Encounter (Signed)
Pt dropped of forms to be filled out put in your folder she would like it mailed when finished and a copy for her  She has the brown folder for the one that needs to be mailed to the dept of education,   She can be reached at [phone]

## 2020-05-18 DIAGNOSIS — M5481 Occipital neuralgia: Secondary | ICD-10-CM | POA: Diagnosis not present

## 2020-05-18 DIAGNOSIS — G43101 Migraine with aura, not intractable, with status migrainosus: Secondary | ICD-10-CM | POA: Diagnosis not present

## 2020-06-20 ENCOUNTER — Telehealth: Payer: Self-pay

## 2020-06-20 ENCOUNTER — Ambulatory Visit: Payer: Medicare HMO | Admitting: Family Medicine

## 2020-06-20 NOTE — Telephone Encounter (Signed)
P.A. ADDERALL XR 20MG  (BRAND)

## 2020-06-21 ENCOUNTER — Telehealth: Payer: Self-pay

## 2020-06-21 NOTE — Telephone Encounter (Signed)
My note states that she wanted to be lowered to the 20 mg.  See how she is doing on that.

## 2020-06-21 NOTE — Telephone Encounter (Signed)
Called and left message to see how she is doing on the 20mg 

## 2020-06-21 NOTE — Telephone Encounter (Signed)
Pt states generic gave her side effects of jitteriness and it was very inconsistent and not as effective as the brand

## 2020-06-21 NOTE — Telephone Encounter (Signed)
Can you verify if pt is supposed to be on 20mg  or 30 mg, looks like you switched her to 20mg  but the refills are for 30mg ?

## 2020-06-23 NOTE — Telephone Encounter (Signed)
P.A. approved, left message for pt  °

## 2020-06-24 ENCOUNTER — Other Ambulatory Visit: Payer: Self-pay | Admitting: Family Medicine

## 2020-06-24 DIAGNOSIS — M5481 Occipital neuralgia: Secondary | ICD-10-CM

## 2020-06-24 NOTE — Telephone Encounter (Signed)
[facility]fran. Please advise [facility]

## 2020-06-27 ENCOUNTER — Telehealth: Payer: Self-pay

## 2020-06-27 DIAGNOSIS — F909 Attention-deficit hyperactivity disorder, unspecified type: Secondary | ICD-10-CM

## 2020-06-27 MED ORDER — AMPHETAMINE-DEXTROAMPHET ER 20 MG PO CP24: 20.0000 mg | ORAL_CAPSULE | ORAL | 0 refills | Status: DC

## 2020-06-27 NOTE — Telephone Encounter (Signed)
I called her Adderall in but since she is seeing a neurologist for her headaches, and the medications concerning that should be handled through the neurologist.

## 2020-06-27 NOTE — Telephone Encounter (Signed)
Pt called back and said the 20mg  Adderall XR is working ok, may not be quite strong enough but wants to try for another month and see how it does.  Please sent in 20mg  to pharmacy.  Also said needs refill of Percocet to pharmacy.  that this is her bad time of year with the weather makes her migraines worse and would like a refill.

## 2020-06-28 NOTE — Telephone Encounter (Signed)
LVM for pt advising of the script being sent in and to reach out to her neurologist for headache med. KH

## 2020-06-29 NOTE — Telephone Encounter (Signed)
LVM for pt to call back and advise if she got my message. KH

## 2020-07-02 NOTE — Telephone Encounter (Signed)
done

## 2020-07-22 ENCOUNTER — Telehealth (INDEPENDENT_AMBULATORY_CARE_PROVIDER_SITE_OTHER): Payer: Medicare HMO | Admitting: Family Medicine

## 2020-07-22 ENCOUNTER — Encounter: Payer: Self-pay | Admitting: Family Medicine

## 2020-07-22 DIAGNOSIS — J0110 Acute frontal sinusitis, unspecified: Secondary | ICD-10-CM | POA: Diagnosis not present

## 2020-07-22 MED ORDER — AMOXICILLIN-POT CLAVULANATE 875-125 MG PO TABS: 1.0000 | ORAL_TABLET | Freq: Two times a day (BID) | ORAL | 0 refills | Status: DC

## 2020-07-22 NOTE — Progress Notes (Signed)
   Subjective:  Documentation for virtual audio and video telecommunications through [facility] encounter: Difficulty with video so we switched to telephone  The patient was located at home. 2 patient identifiers used.  The provider was located in the office. The patient did consent to this visit and is aware of possible charges through their insurance for this visit.  The other persons participating in this telemedicine service were none. Time spent on call was 14 minutes and in review of previous records 18 minutes total.  This virtual service is not related to other E/M service within previous 7 days.   Patient ID: Vanessa Sutton, female    DOB: [birth year], 55 y.o.   MRN: [id number]  HPI Chief Complaint  Patient presents with  . other    Sinus infection, bad headache, pressure on face ears itching, teeth hurting started 1 week ago   Complains of a one week history of sinus pressure , ears itching, frontal headache and upper teeth pain.   History of sinusitis and states her current symptoms feel like her usual sinus infections.  Reports her last sinus infection was 6 months ago approximately.  Denies fever, chills, dizziness, chest pain, shortness of breath, nausea, vomiting or diarrhea.   Review of Systems     Objective:   Physical Exam Wt 135 lb (61.2 kg)   LMP 10/02/2014 (Approximate)   BMI 25.51 kg/m   Alert and oriented and in no acute distress.  Speaking in complete senses that difficulty.  Normal speech      Assessment & Plan:  Acute frontal sinusitis, recurrence not specified - Plan: amoxicillin-clavulanate (AUGMENTIN) 875-125 MG tablet  Augmentin prescribed.  Discussed symptomatic treatment including using a decongestant, Mucinex, Tylenol or Advil as well as good hydration.  She may also consider using a nasal saline rinse.  Follow-up if worsening or not back to baseline after she completes the antibiotic.

## 2020-08-16 ENCOUNTER — Other Ambulatory Visit: Payer: Self-pay | Admitting: Family Medicine

## 2020-08-16 DIAGNOSIS — J0110 Acute frontal sinusitis, unspecified: Secondary | ICD-10-CM

## 2020-08-16 NOTE — Telephone Encounter (Signed)
Pt has appt on 3/31. Left message for pt to call me back

## 2020-08-18 ENCOUNTER — Ambulatory Visit: Payer: Medicare HMO | Admitting: Family Medicine

## 2020-10-27 ENCOUNTER — Telehealth (INDEPENDENT_AMBULATORY_CARE_PROVIDER_SITE_OTHER): Payer: Medicare HMO | Admitting: Family Medicine

## 2020-10-27 ENCOUNTER — Encounter: Payer: Self-pay | Admitting: Family Medicine

## 2020-10-27 VITALS — Temp 98.0°F | Ht 61.0 in | Wt 135.0 lb

## 2020-10-27 DIAGNOSIS — F909 Attention-deficit hyperactivity disorder, unspecified type: Secondary | ICD-10-CM

## 2020-10-27 MED ORDER — AMPHETAMINE-DEXTROAMPHET ER 30 MG PO CP24: 30.0000 mg | ORAL_CAPSULE | Freq: Every day | ORAL | 0 refills | Status: DC

## 2020-10-27 NOTE — Progress Notes (Signed)
   Subjective:    Patient ID: Vanessa Sutton, female    DOB: 1965/08/30, 55 y.o.   MRN: [id number]  HPI Documentation for virtual audio and video telecommunications through [place] encounter: The patient was located at home. 2 patient identifiers used.  The provider was located in the office. The patient did consent to this visit and is aware of possible charges through their insurance for this visit. The other persons participating in this telemedicine service were none. Time spent on call was 5 minutes and in review of previous records >20 minutes total for counseling and coordination of care. This virtual service is not related to other E/M service within previous 7 days.  Today's visit is to discuss her ADD medication.  She has been trying to cut back on her need for Adderall.  She was originally on 30 mg twice per day and dropped this down to 20 and then down to 1/day and notes that she has not been able to stay focused.  She would like to go back to the 30 mg twice per day dosing which in the past has worked quite well for her.  Review of Systems     Objective:   Physical Exam Alert and in no distress otherwise not examined       Assessment & Plan:  Attention deficit hyperactivity disorder (ADHD), unspecified ADHD type - Plan: amphetamine-dextroamphetamine (ADDERALL XR) 30 MG 24 hr capsule I think is very reasonable for her to go back to the dosing regimen that has worked well in the past.  I will place her back on it, she is to let me know if it works, how long it works and if she has any difficulties with a virtual visit in approximately 1 month.

## 2020-12-09 ENCOUNTER — Telehealth: Payer: Self-pay | Admitting: Family Medicine

## 2020-12-09 NOTE — Telephone Encounter (Signed)
Needs refill Adderral XR 30 Name Brand         Medcheck scheduled for 8/11

## 2020-12-13 ENCOUNTER — Other Ambulatory Visit: Payer: Self-pay

## 2020-12-13 ENCOUNTER — Telehealth (INDEPENDENT_AMBULATORY_CARE_PROVIDER_SITE_OTHER): Payer: Medicare HMO | Admitting: Family Medicine

## 2020-12-13 ENCOUNTER — Encounter: Payer: Self-pay | Admitting: Family Medicine

## 2020-12-13 DIAGNOSIS — F909 Attention-deficit hyperactivity disorder, unspecified type: Secondary | ICD-10-CM | POA: Diagnosis not present

## 2020-12-13 MED ORDER — ADDERALL XR 30 MG PO CP24: 30.0000 mg | ORAL_CAPSULE | Freq: Two times a day (BID) | ORAL | 0 refills | Status: DC

## 2020-12-13 MED ORDER — AMPHETAMINE-DEXTROAMPHET ER 30 MG PO CP24: 30.0000 mg | ORAL_CAPSULE | Freq: Two times a day (BID) | ORAL | 0 refills | Status: DC

## 2020-12-13 NOTE — Progress Notes (Signed)
   Subjective:    Patient ID: Vanessa Sutton, female    DOB: 1965-08-20, 55 y.o.   MRN: [id number]  HPI Documentation for virtual audio and video telecommunications through [place] encounter: The patient was located at home. 2 patient identifiers used.  The provider was located in the office. The patient did consent to this visit and is aware of possible charges through their insurance for this visit. The other persons participating in this telemedicine service were none. Time spent on call was 10 minutes and in review of previous records >20 minutes total for counseling and coordination of care. This virtual service is not related to other E/M service within previous 7 days.  Virtual visit was to discuss her ADD medication.  She is now taking Adderall XR 30 mg and getting roughly 6 hours of benefit out of it.  She would like to increase it to twice per day.  She has been on that dosing in the past.  Review of Systems     Objective:   Physical Exam Alert and in no distress otherwise not examined       Assessment & Plan:  Attention deficit hyperactivity disorder (ADHD), unspecified ADHD type - Plan: ADDERALL XR 30 MG 24 hr capsule, DISCONTINUED: amphetamine-dextroamphetamine (ADDERALL XR) 30 MG 24 hr capsule Patient asked for d.a.w. which I complied with.  She is to call me and abruptly 1 month to let me know how she is doing on that and if it is having any effect on her especially considering sleep.

## 2020-12-29 ENCOUNTER — Telehealth: Payer: Medicare HMO | Admitting: Family Medicine

## 2021-02-07 ENCOUNTER — Encounter: Payer: Self-pay | Admitting: Family Medicine

## 2021-02-07 ENCOUNTER — Ambulatory Visit (INDEPENDENT_AMBULATORY_CARE_PROVIDER_SITE_OTHER): Payer: Medicare HMO | Admitting: Family Medicine

## 2021-02-07 ENCOUNTER — Other Ambulatory Visit: Payer: Self-pay

## 2021-02-07 VITALS — BP 110/70 | HR 68 | Temp 97.2°F | Ht 61.5 in | Wt 124.0 lb

## 2021-02-07 DIAGNOSIS — Z23 Encounter for immunization: Secondary | ICD-10-CM

## 2021-02-07 DIAGNOSIS — Z79899 Other long term (current) drug therapy: Secondary | ICD-10-CM | POA: Diagnosis not present

## 2021-02-07 DIAGNOSIS — Z1231 Encounter for screening mammogram for malignant neoplasm of breast: Secondary | ICD-10-CM | POA: Diagnosis not present

## 2021-02-07 DIAGNOSIS — Z13228 Encounter for screening for other metabolic disorders: Secondary | ICD-10-CM | POA: Diagnosis not present

## 2021-02-07 DIAGNOSIS — Z13820 Encounter for screening for osteoporosis: Secondary | ICD-10-CM | POA: Diagnosis not present

## 2021-02-07 DIAGNOSIS — M5481 Occipital neuralgia: Secondary | ICD-10-CM

## 2021-02-07 DIAGNOSIS — Z13220 Encounter for screening for lipoid disorders: Secondary | ICD-10-CM | POA: Diagnosis not present

## 2021-02-07 DIAGNOSIS — F17200 Nicotine dependence, unspecified, uncomplicated: Secondary | ICD-10-CM

## 2021-02-07 DIAGNOSIS — Z560 Unemployment, unspecified: Secondary | ICD-10-CM

## 2021-02-07 DIAGNOSIS — Z0000 Encounter for general adult medical examination without abnormal findings: Secondary | ICD-10-CM | POA: Diagnosis not present

## 2021-02-07 DIAGNOSIS — F909 Attention-deficit hyperactivity disorder, unspecified type: Secondary | ICD-10-CM | POA: Diagnosis not present

## 2021-02-07 MED ORDER — ADDERALL XR 30 MG PO CP24: 30.0000 mg | ORAL_CAPSULE | Freq: Two times a day (BID) | ORAL | 0 refills | Status: DC

## 2021-02-07 MED ORDER — AMPHETAMINE-DEXTROAMPHET ER 30 MG PO CP24: 30.0000 mg | ORAL_CAPSULE | Freq: Two times a day (BID) | ORAL | 0 refills | Status: DC

## 2021-02-07 NOTE — Progress Notes (Signed)
Vanessa Sutton is a 55 y.o. female who presents for annual wellness visit CPE and follow-up on chronic medical conditions.  She has some lesions in the palm of her hand that she has some concerns around.  They are not interfering with her function.  She does have underlying ADHD and states that the Adderall lasts roughly 6 hours.  She takes that twice per day.  Helps her stay focused and she does get antsy after the medicine wears off.  She continues to be followed by neurology at [facility] for her chronic occipital neuralgia.  She does have a previous history of a several year  use of steroids for control of her occipital neuralgia and has concerns over bone loss.  She has started smoking again and also has intermittently been drinking.  She admits to falling off and on the wagon but has not gotten involved in any counseling to help with that.  She has not sought gynecologic care but plans to do that.  She otherwise has no particular concerns or complaints.  Immunizations and Health Maintenance Immunization History  Administered Date(s) Administered   Influenza,inj,Quad PF,6+ Mos 04/30/2016, 02/22/2019   Moderna Sars-Covid-2 Vaccination 08/20/2019, 09/17/2019   PPD Test 09/14/2013   Tdap 02/29/2012, 10/09/2014   Health Maintenance Due  Topic Date Due   Zoster Vaccines- Shingrix (1 of 2) Never done   PAP SMEAR-Modifier  02/16/2011   MAMMOGRAM  Never done   COVID-19 Vaccine (3 - Moderna risk series) 10/15/2019   INFLUENZA VACCINE  12/19/2020    Last Pap smear: 2017 per pt  Last mammogram: never Last colonoscopy: cologurad 03/25/19 Last DEXA: N/A Dentist: over 10 years Ophtho: Q eye Exercise: walking 5 days a week for one hour   Other doctors caring for patient include: [facility] neurology Dr. Apolonio Schneiders  Advanced directives: Does Patient Have a Medical Advance Directive?: Yes Type of Advance Directive: Living will Does patient want to make changes to medical advance directive?: No -  Patient declined Copy of Healthcare Power of Attorney in Chart?: No - copy requested  Depression screen:  See questionnaire below.  Depression screen [facility] 2/9 02/07/2021 03/23/2020 12/17/2018 04/30/2016  Decreased Interest 0 - 0 0  Down, Depressed, Hopeless 0 0 0 0  PHQ - 2 Score 0 0 0 0    Fall Risk Screen: see questionnaire below. Fall Risk  02/07/2021 12/17/2018 04/30/2016  Falls in the past year? 0 0 No  Number falls in past yr: 0 - -  Injury with Fall? 0 - -  Risk for fall due to : No Fall Risks - -  Follow up Falls evaluation completed - -    ADL screen:  See questionnaire below Functional Status Survey: Is the patient deaf or have difficulty hearing?: No Does the patient have difficulty seeing, even when wearing glasses/contacts?: No Does the patient have difficulty concentrating, remembering, or making decisions?: No Does the patient have difficulty walking or climbing stairs?: No Does the patient have difficulty dressing or bathing?: No Does the patient have difficulty doing errands alone such as visiting a doctor's office or shopping?: No   Review of Systems Constitutional: -, -unexpected weight change, -anorexia, -fatigue Allergy: -sneezing, -itching, -congestion Dermatology: denies changing moles, rash, lumps ENT: -runny nose, -ear pain, -sore throat,  Cardiology:  -chest pain, -palpitations, -orthopnea, Respiratory: -cough, -shortness of breath, -dyspnea on exertion, -wheezing,  Gastroenterology: -abdominal pain, -nausea, -vomiting, -diarrhea, -constipation, -dysphagia Hematology: -bleeding or bruising problems Musculoskeletal: -arthralgias, -myalgias, -joint swelling, -back  pain, - Ophthalmology: -vision changes,  Urology: -dysuria, -difficulty urinating,  -urinary frequency, -urgency, incontinence    PHYSICAL EXAM:  LMP 10/02/2014 (Approximate)   General Appearance: Alert, cooperative, no distress, appears stated age Head: Normocephalic, without obvious  abnormality, atraumatic Eyes: PERRL, conjunctiva/corneas clear, EOM's intact,  Ears: Normal TM's and external ear canals Nose: Nares normal, mucosa normal, no drainage or sinus tenderness Throat: Lips, mucosa, and tongue normal; teeth and gums normal Neck: Supple, no lymphadenopathy;  thyroid:  no enlargement/tenderness/nodules; no carotid bruit or JVD Lungs: Clear to auscultation bilaterally without wheezes, rales or ronchi; respirations unlabored Heart: Regular rate and rhythm, S1 and S2 normal, no murmur, rubor gallop Abdomen: Soft, non-tender, nondistended, normoactive bowel sounds,  no masses, no hepatosplenomegaly Extremities: No clubbing, cyanosis or edema Pulses: 2+ and symmetric all extremities Skin:  Skin color, texture, turgor normal, no rashes or lesions Lymph nodes: Cervical, supraclavicular, and axillary nodes normal Neurologic:  CNII-XII intact, normal strength, sensation and gait; reflexes 2+ and symmetric throughout Psych: Normal mood, affect, hygiene and grooming.  ASSESSMENT/PLAN: Routine general medical examination at a health care facility - Plan: CBC with Differential/Platelet, Comprehensive metabolic panel, Lipid panel  Attention deficit hyperactivity disorder (ADHD), unspecified ADHD type - Plan: ADDERALL XR 30 MG 24 hr capsule, amphetamine-dextroamphetamine (ADDERALL XR) 30 MG 24 hr capsule, amphetamine-dextroamphetamine (ADDERALL XR) 30 MG 24 hr capsule  Not currently working due to disabled status  Current smoker  Need for influenza vaccination - Plan: Flu Vaccine QUAD 37mo+IM (Fluarix, Fluzone & Alfiuria Quad PF)  Screening for osteoporosis - Plan: DG Bone Density  Occipital neuralgia, unspecified laterality  Encounter for screening mammogram for malignant neoplasm of breast - Plan: MM Digital Screening I discussed smoking cessation as well as alcohol consumption with her and strongly encouraged her to get involved in a counseling program.  Mentioned the  [facility].  Explained that it could be difficult because of her being on Medicaid to get a therapist. Discussed  yearly mammograms; at least 30 minutes of aerobic activity at least 5 days/week and weight-bearing exercise 2x/week;  healthy diet, including goals of calcium and vitamin D intake and alcohol recommendations (less than or equal to 1 drink/day) reviewed; [place] [facility]  Immunization recommendations discussed.  Colonoscopy recommendations reviewed   Medicare Attestation I have personally reviewed: The patient's medical and social history Their use of alcohol, tobacco or illicit drugs Their current medications and supplements The patient's functional ability including ADLs,fall risks, home safety risks, cognitive, and hearing and visual impairment Diet and physical activities Evidence for depression or mood disorders  The patient's weight, height, and BMI have been recorded in the chart.  I have made referrals, counseling, and provided education to the patient based on review of the above and I have provided the patient with a written personalized care plan for preventive services.     Sharlot Gowda, MD   02/07/2021

## 2021-02-08 LAB — CBC WITH DIFFERENTIAL/PLATELET
Basophils Absolute: 0.1 10*3/uL (ref 0.0–0.2)
Basos: 1 %
EOS (ABSOLUTE): 0.3 10*3/uL (ref 0.0–0.4)
Eos: 6 %
Hematocrit: 40.2 % (ref 34.0–46.6)
Hemoglobin: 13.7 g/dL (ref 11.1–15.9)
Immature Grans (Abs): 0 10*3/uL (ref 0.0–0.1)
Immature Granulocytes: 0 %
Lymphocytes Absolute: 1.8 10*3/uL (ref 0.7–3.1)
Lymphs: 35 %
MCH: 33.4 pg — ABNORMAL HIGH (ref 26.6–33.0)
MCHC: 34.1 g/dL (ref 31.5–35.7)
MCV: 98 fL — ABNORMAL HIGH (ref 79–97)
Monocytes Absolute: 0.5 10*3/uL (ref 0.1–0.9)
Monocytes: 10 %
Neutrophils Absolute: 2.4 10*3/uL (ref 1.4–7.0)
Neutrophils: 48 %
Platelets: 239 10*3/uL (ref 150–450)
RBC: 4.1 x10E6/uL (ref 3.77–5.28)
RDW: 11.8 % (ref 11.7–15.4)
WBC: 5 10*3/uL (ref 3.4–10.8)

## 2021-02-08 LAB — COMPREHENSIVE METABOLIC PANEL
ALT: 55 IU/L — ABNORMAL HIGH (ref 0–32)
AST: 41 IU/L — ABNORMAL HIGH (ref 0–40)
Albumin/Globulin Ratio: 2.3 — ABNORMAL HIGH (ref 1.2–2.2)
Albumin: 4.8 g/dL (ref 3.8–4.9)
Alkaline Phosphatase: 75 IU/L (ref 44–121)
BUN/Creatinine Ratio: 11 (ref 9–23)
BUN: 7 mg/dL (ref 6–24)
Bilirubin Total: 0.5 mg/dL (ref 0.0–1.2)
CO2: 23 mmol/L (ref 20–29)
Calcium: 10.7 mg/dL — ABNORMAL HIGH (ref 8.7–10.2)
Chloride: 105 mmol/L (ref 96–106)
Creatinine, Ser: 0.63 mg/dL (ref 0.57–1.00)
Globulin, Total: 2.1 g/dL (ref 1.5–4.5)
Glucose: 92 mg/dL (ref 65–99)
Potassium: 4.7 mmol/L (ref 3.5–5.2)
Sodium: 140 mmol/L (ref 134–144)
Total Protein: 6.9 g/dL (ref 6.0–8.5)
eGFR: 107 mL/min/{1.73_m2} (ref >59)

## 2021-02-08 LAB — LIPID PANEL
Chol/HDL Ratio: 2.6 ratio (ref 0.0–4.4)
Cholesterol, Total: 196 mg/dL (ref 100–199)
HDL: 74 mg/dL (ref >39)
LDL Chol Calc (NIH): 109 mg/dL — ABNORMAL HIGH (ref 0–99)
Triglycerides: 71 mg/dL (ref 0–149)
VLDL Cholesterol Cal: 13 mg/dL (ref 5–40)

## 2021-03-02 ENCOUNTER — Other Ambulatory Visit: Payer: Medicare Other

## 2021-04-17 ENCOUNTER — Telehealth: Payer: Self-pay

## 2021-04-17 ENCOUNTER — Other Ambulatory Visit: Payer: Self-pay

## 2021-04-17 MED ORDER — EPINEPHRINE 0.3 MG/0.3ML IJ SOAJ: 0.3000 mg | INTRAMUSCULAR | 0 refills | Status: DC | PRN

## 2021-04-17 NOTE — Telephone Encounter (Signed)
Recv'd refill request from [facility] for Epipen 2 pak 0.3mg 

## 2021-04-17 NOTE — Telephone Encounter (Signed)
done

## 2021-04-17 NOTE — Telephone Encounter (Signed)
Dr. Susann Givens I recv'd refill request from [facility] for Epipen 2 pak 0.3mg  & you filled this in the past for pt, but she  hasn't had filled in a couple years, is this ok to refill ?

## 2021-04-21 NOTE — Telephone Encounter (Signed)
done

## 2021-06-01 ENCOUNTER — Other Ambulatory Visit: Payer: Self-pay | Admitting: Family Medicine

## 2021-06-01 DIAGNOSIS — M5481 Occipital neuralgia: Secondary | ICD-10-CM

## 2021-06-27 ENCOUNTER — Ambulatory Visit (INDEPENDENT_AMBULATORY_CARE_PROVIDER_SITE_OTHER): Payer: Medicare HMO | Admitting: Family Medicine

## 2021-06-27 ENCOUNTER — Other Ambulatory Visit: Payer: Self-pay

## 2021-06-27 ENCOUNTER — Encounter: Payer: Self-pay | Admitting: Family Medicine

## 2021-06-27 VITALS — BP 114/70 | HR 105 | Temp 97.4°F | Wt 129.6 lb

## 2021-06-27 DIAGNOSIS — S70311A Abrasion, right thigh, initial encounter: Secondary | ICD-10-CM | POA: Diagnosis not present

## 2021-06-27 NOTE — Patient Instructions (Signed)
Ice for 20 minutes 3 times per day.  Tylenol for the pain 2 tablets 4 times per day as needed and if he needs something extra you can take either Advil or Aleve.  After about 3 days then he can switch to heat for 20 minutes 3 times per

## 2021-06-27 NOTE — Progress Notes (Signed)
° °  Subjective:    Patient ID: Vanessa Sutton, female    DOB: [birth year], 56 y.o.   MRN: [id number]  HPI Earlier today she fell through the flooring on her porch and damaged the right lateral thigh area.  She is here for further evaluation of that.     Review of Systems     Objective:   Physical Exam Exam of the right thigh does show a 4 x 5 inch area of abrasion but the skin is not broken.  It is dry.  Her last tetanus was in 2016.       Assessment & Plan:  Abrasion of right thigh, initial encounter Ice for 20 minutes 3 times per day.  Tylenol for the pain 2 tablets 4 times per day as needed and if he needs something extra you can take either Advil or Aleve.  After about 3 days then he can switch to heat for 20 minutes 3 times per

## 2021-07-12 ENCOUNTER — Telehealth: Payer: Self-pay

## 2021-07-12 NOTE — Telephone Encounter (Signed)
P.A. ADDERALL XR BRAND

## 2021-07-12 NOTE — Telephone Encounter (Signed)
P.A. approved til 05/20/22, left message for pt

## 2021-07-18 ENCOUNTER — Telehealth: Payer: Self-pay

## 2021-07-18 ENCOUNTER — Other Ambulatory Visit: Payer: Self-pay | Admitting: Medical

## 2021-07-18 DIAGNOSIS — F909 Attention-deficit hyperactivity disorder, unspecified type: Secondary | ICD-10-CM

## 2021-07-18 MED ORDER — AMPHETAMINE-DEXTROAMPHET ER 30 MG PO CP24: 30.0000 mg | ORAL_CAPSULE | Freq: Two times a day (BID) | ORAL | 0 refills | Status: DC

## 2021-07-18 NOTE — Telephone Encounter (Signed)
Pt needs refill  Brand Adderall XR 30mg  BID #60 to (patient already called to make sure they had in stock)

## 2021-07-20 NOTE — Telephone Encounter (Signed)
done

## 2021-08-28 ENCOUNTER — Telehealth: Payer: Self-pay | Admitting: Internal Medicine

## 2021-08-28 DIAGNOSIS — F909 Attention-deficit hyperactivity disorder, unspecified type: Secondary | ICD-10-CM

## 2021-08-28 MED ORDER — ADDERALL XR 30 MG PO CP24: 30.0000 mg | ORAL_CAPSULE | Freq: Two times a day (BID) | ORAL | 0 refills | Status: DC

## 2021-08-28 MED ORDER — AMPHETAMINE-DEXTROAMPHET ER 30 MG PO CP24: 30.0000 mg | ORAL_CAPSULE | Freq: Two times a day (BID) | ORAL | 0 refills | Status: DC

## 2021-08-28 NOTE — Telephone Encounter (Signed)
Pt called and wants a refill on her Adderall XR 30mg  to be sent in to [facility] ?

## 2021-11-20 ENCOUNTER — Telehealth: Payer: Self-pay | Admitting: Family Medicine

## 2021-11-20 NOTE — Telephone Encounter (Signed)
Received medical records requested from [facility]. Forwarded to HIM via fax.

## 2021-12-11 ENCOUNTER — Telehealth: Payer: Self-pay | Admitting: Family Medicine

## 2021-12-11 DIAGNOSIS — F909 Attention-deficit hyperactivity disorder, unspecified type: Secondary | ICD-10-CM

## 2021-12-11 MED ORDER — ADDERALL XR 30 MG PO CP24: 30.0000 mg | ORAL_CAPSULE | Freq: Two times a day (BID) | ORAL | 0 refills | Status: DC

## 2021-12-11 MED ORDER — AMPHETAMINE-DEXTROAMPHET ER 30 MG PO CP24: 30.0000 mg | ORAL_CAPSULE | Freq: Two times a day (BID) | ORAL | 0 refills | Status: AC

## 2021-12-11 NOTE — Telephone Encounter (Signed)
Pt called and is requesting a refill on her adderall please send to the [facility] [address], [place] - [address]

## 2022-01-24 ENCOUNTER — Encounter: Payer: Self-pay | Admitting: Internal Medicine

## 2022-01-29 ENCOUNTER — Telehealth: Payer: Self-pay | Admitting: Family Medicine

## 2022-01-29 NOTE — Telephone Encounter (Signed)
Left message for patient to call back and schedule Medicare Annual Wellness Visit (AWV) either virtually or in office. I left my number for patient to call [phone].  Last AWV ;02/07/21  please schedule at anytime with health coach    .   

## 2022-02-12 ENCOUNTER — Ambulatory Visit: Payer: Medicare HMO | Admitting: Family Medicine

## 2022-02-15 ENCOUNTER — Telehealth: Payer: Self-pay | Admitting: Family Medicine

## 2022-02-15 NOTE — Telephone Encounter (Signed)
Left message for patient to call back and schedule Medicare Annual Wellness Visit (AWV) either virtually or in office. Left  my Herbie Drape number [phone]   Last AWV 02/07/21 please schedule with Nurse Health Adviser  I wanted to see if patient could do AWV prior to appt w/pcp  45 min for awv-i and in office appointments 30 min for awv-s  phone/virtual appointments

## 2022-02-27 ENCOUNTER — Encounter: Payer: Self-pay | Admitting: Internal Medicine

## 2022-03-12 ENCOUNTER — Encounter: Payer: Self-pay | Admitting: Internal Medicine

## 2022-03-14 ENCOUNTER — Telehealth: Payer: Self-pay | Admitting: Family Medicine

## 2022-03-14 NOTE — Telephone Encounter (Signed)
Left message for patient to call back and schedule Medicare Annual Wellness Visit (AWV) either virtually or in office. Left  my Herbie Drape number [phone]   Last AWV 02/07/21 please schedule with Nurse Health Adviser  I wanted to see if patient would schedule AWV w/[place] prior to appt with Provider   45 min for awv-i and in office appointments 30 min for awv-s  phone/virtual appointments

## 2022-03-27 ENCOUNTER — Ambulatory Visit: Payer: Medicare HMO | Admitting: Family Medicine

## 2022-04-11 ENCOUNTER — Telehealth: Payer: Self-pay | Admitting: Family Medicine

## 2022-04-11 NOTE — Telephone Encounter (Signed)
Left message for patient to call back and schedule Medicare Annual Wellness Visit (AWV) either virtually or in office. Left  my jabber number [phone]   Last AWV 02/07/21 please schedule with Nurse Health Adviser   45 min for awv-i and in office appointments 30 min for awv-s  phone/virtual appointments   

## 2022-05-17 ENCOUNTER — Telehealth: Payer: Self-pay | Admitting: Family Medicine

## 2022-05-17 NOTE — Telephone Encounter (Signed)
Left message for patient to call back and schedule Medicare Annual Wellness Visit (AWV) either virtually or in office. Left  my jabber number [phone]   Last AWV 02/07/21 please schedule with Nurse Health Adviser   45 min for awv-i and in office appointments 30 min for awv-s  phone/virtual appointments   

## 2022-05-24 ENCOUNTER — Ambulatory Visit
Admission: RE | Admit: 2022-05-24 | Discharge: 2022-05-24 | Disposition: A | Discharge status: Home / Self Care | Payer: Medicare Other | Source: Ambulatory Visit | Attending: Family Medicine | Admitting: Family Medicine

## 2022-05-24 DIAGNOSIS — Z780 Asymptomatic menopausal state: Secondary | ICD-10-CM | POA: Diagnosis not present

## 2022-05-24 DIAGNOSIS — Z1231 Encounter for screening mammogram for malignant neoplasm of breast: Secondary | ICD-10-CM | POA: Diagnosis not present

## 2022-05-24 DIAGNOSIS — M8589 Other specified disorders of bone density and structure, multiple sites: Secondary | ICD-10-CM | POA: Diagnosis not present

## 2022-07-25 ENCOUNTER — Other Ambulatory Visit: Payer: Self-pay | Admitting: Family Medicine

## 2022-07-25 ENCOUNTER — Encounter: Payer: Self-pay | Admitting: Family Medicine

## 2022-07-25 ENCOUNTER — Ambulatory Visit (INDEPENDENT_AMBULATORY_CARE_PROVIDER_SITE_OTHER): Payer: Medicare HMO | Admitting: Family Medicine

## 2022-07-25 VITALS — BP 128/84 | HR 86 | Temp 97.9°F | Resp 14 | Ht 60.0 in | Wt 137.6 lb

## 2022-07-25 DIAGNOSIS — M8580 Other specified disorders of bone density and structure, unspecified site: Secondary | ICD-10-CM

## 2022-07-25 DIAGNOSIS — F17200 Nicotine dependence, unspecified, uncomplicated: Secondary | ICD-10-CM

## 2022-07-25 DIAGNOSIS — Z87891 Personal history of nicotine dependence: Secondary | ICD-10-CM

## 2022-07-25 DIAGNOSIS — M5481 Occipital neuralgia: Secondary | ICD-10-CM

## 2022-07-25 DIAGNOSIS — F339 Major depressive disorder, recurrent, unspecified: Secondary | ICD-10-CM | POA: Diagnosis not present

## 2022-07-25 DIAGNOSIS — F909 Attention-deficit hyperactivity disorder, unspecified type: Secondary | ICD-10-CM

## 2022-07-25 DIAGNOSIS — Z0000 Encounter for general adult medical examination without abnormal findings: Secondary | ICD-10-CM

## 2022-07-25 DIAGNOSIS — R748 Abnormal levels of other serum enzymes: Secondary | ICD-10-CM

## 2022-07-25 DIAGNOSIS — Z1321 Encounter for screening for nutritional disorder: Secondary | ICD-10-CM | POA: Diagnosis not present

## 2022-07-25 DIAGNOSIS — Z1211 Encounter for screening for malignant neoplasm of colon: Secondary | ICD-10-CM

## 2022-07-25 MED ORDER — UBRELVY 50 MG PO TABS: 1.0000 | ORAL_TABLET | ORAL | 0 refills | Status: DC | PRN

## 2022-07-25 MED ORDER — CITALOPRAM HYDROBROMIDE 20 MG PO TABS: 20.0000 mg | ORAL_TABLET | Freq: Every day | ORAL | 1 refills | Status: DC

## 2022-07-25 MED ORDER — ADDERALL XR 30 MG PO CP24: 30.0000 mg | ORAL_CAPSULE | Freq: Two times a day (BID) | ORAL | 0 refills | Status: AC

## 2022-07-25 NOTE — Patient Instructions (Signed)
  Ms. Vanessa Sutton , Thank you for taking time to come for your Medicare Wellness Visit. I appreciate your ongoing commitment to your health goals. Please review the following plan we discussed and let me know if I can assist you in the future.   These are the goals we discussed: Virtual appointment in 1 month   This is a list of the screening recommended for you and due dates:  Health Maintenance  Topic Date Due   HIV Screening  Never done   Pap Smear  07/15/2015   Zoster (Shingles) Vaccine (1 of 2) Never done   COVID-19 Vaccine (3 - 2023-24 season) 01/19/2022   Medicare Annual Wellness Visit  02/07/2022   Cologuard (Stool DNA test)  03/24/2022   Flu Shot  08/19/2022*   Mammogram  05/24/2024   DTaP/Tdap/Td vaccine (3 - Td or Tdap) 10/08/2024   Hepatitis C Screening: USPSTF Recommendation to screen - Ages 60-79 yo.  Completed   HPV Vaccine  Aged Out   Colon Cancer Screening  Discontinued  *Topic was postponed. The date shown is not the original due date.

## 2022-07-25 NOTE — Progress Notes (Signed)
Vanessa Sutton is a 57 y.o. female who presents for annual wellness visit,CPE and follow-up on chronic medical conditions.  She has continued difficulty with headaches.  She was last seen in [place] in 2021.  Apparently they did try Nurtec but it caused difficulty with nausea.  She did not follow-up with them since then.  She recently had a DEXA scan which did show evidence of osteopenia.  She continues to smoke and also drinks when she gets stressed.  She thinks that her depression has come back and she would likely place back on Celexa which has helped her in the past.  She continues on her Adderall and states that she gets roughly 5 hours of benefit and would like to continue on that.  Immunizations and Health Maintenance Immunization History  Administered Date(s) Administered   Influenza,inj,Quad PF,6+ Mos 04/30/2016, 02/22/2019, 02/07/2021   Moderna Sars-Covid-2 Vaccination 08/20/2019, 09/17/2019   PPD Test 09/14/2013   Tdap 02/29/2012, 10/09/2014   Health Maintenance Due  Topic Date Due   HIV Screening  Never done   PAP SMEAR-Modifier  07/15/2015   Zoster Vaccines- Shingrix (1 of 2) Never done   COVID-19 Vaccine (3 - 2023-24 season) 01/19/2022   Medicare Annual Wellness (AWV)  02/07/2022   Fecal DNA (Cologuard)  03/24/2022    Last Pap smear:  2017-done at an urgent care Last mammogram:05/24/2022 Last colonoscopy: 2006 Last DEXA: 05/24/2022 Dentist: within the last year Ophtho: more than 1 year  Exercise: aerobics 6 days a week  Other doctors caring for patient include: None  Advanced directives: Yes. Not on file info asked for    Depression screen:  See questionnaire below.     07/25/2022    1:55 PM 02/07/2021    2:26 PM 03/23/2020    1:21 PM 12/17/2018    1:29 PM 04/30/2016   10:08 AM  Depression screen PHQ 2/9  Decreased Interest 1 0  0 0  Down, Depressed, Hopeless 1 0 0 0 0  PHQ - 2 Score 2 0 0 0 0  Altered sleeping 1      Tired, decreased energy 3       Change in appetite 2      Feeling bad or failure about yourself  0      Trouble concentrating 1      Moving slowly or fidgety/restless 2      Suicidal thoughts 0      PHQ-9 Score 11      Difficult doing work/chores Very difficult        Fall Risk Screen: see questionnaire below.    07/25/2022    1:55 PM 02/07/2021    2:26 PM 12/17/2018    1:29 PM 04/30/2016   10:08 AM  Fall Risk   Falls in the past year? 1 0 0 No  Number falls in past yr: 1 0    Injury with Fall? 0 0    Risk for fall due to : History of fall(s) No Fall Risks    Follow up Falls prevention discussed;Falls evaluation completed Falls evaluation completed      ADL screen:  See questionnaire below Functional Status Survey: Is the patient deaf or have difficulty hearing?: No Does the patient have difficulty seeing, even when wearing glasses/contacts?: No Does the patient have difficulty concentrating, remembering, or making decisions?: No Does the patient have difficulty walking or climbing stairs?: Yes Does the patient have difficulty dressing or bathing?: No Does the patient have difficulty doing errands alone such  as visiting a doctor's office or shopping?: No   Review of Systems Constitutional: -, -unexpected weight change, -anorexia, -fatigue Allergy: -sneezing, -itching, -congestion Dermatology: denies changing moles, rash, lumps ENT: -runny nose, -ear pain, -sore throat,  Cardiology:  -chest pain, -palpitations, -orthopnea, Respiratory: -cough, -shortness of breath, -dyspnea on exertion, -wheezing,  Gastroenterology: -abdominal pain, -nausea, -vomiting, -diarrhea, -constipation, -dysphagia Hematology: -bleeding or bruising problems Musculoskeletal: -arthralgias, -myalgias, -joint swelling, -back pain, - Ophthalmology: -vision changes,  Urology: -dysuria, -difficulty urinating,  -urinary frequency, -urgency, incontinence Neurology: -, -numbness, , -memory loss, -falls, -dizziness    PHYSICAL EXAM:  BP  128/84   Pulse 86   Temp 97.9 F (36.6 C) (Oral)   Resp 14   Ht 5' (1.524 m)   Wt 137 lb 9.6 oz (62.4 kg)   LMP 10/02/2014 (Approximate)   SpO2 97% Comment: room air  BMI 26.87 kg/m   General Appearance: Alert, cooperative, no distress, appears stated age Head: Normocephalic, without obvious abnormality, atraumatic Eyes: PERRL, conjunctiva/corneas clear, EOM's intact,  Ears: Normal TM's and external ear canals Nose: Nares normal, mucosa normal, no drainage or sinus tenderness Throat: Lips, mucosa, and tongue normal; teeth and gums normal Neck: Supple, no lymphadenopathy;  thyroid:  no enlargement/tenderness/nodules; no carotid bruit or JVD Lungs: Clear to auscultation bilaterally without wheezes, rales or ronchi; respirations unlabored Heart: Regular rate and rhythm, S1 and S2 normal, no murmur, rubor gallop Abdomen: Soft, non-tender, nondistended, normoactive bowel sounds,  no masses, no hepatosplenomegaly Extremities: No clubbing, cyanosis or edema Pulses: 2+ and symmetric all extremities Skin:  Skin color, texture, turgor normal, no rashes or lesions Neurologic:  CNII-XII intact, normal strength, sensation and gait; reflexes 2+ and symmetric throughout Psych: Normal mood, affect, hygiene and grooming.  ASSESSMENT/PLAN: Routine general medical examination at a health care facility - Plan: CBC with Differential/Platelet, Comprehensive metabolic panel, Lipid panel  Attention deficit hyperactivity disorder (ADHD), unspecified ADHD type - Plan: ADDERALL XR 30 MG 24 hr capsule  Occipital neuralgia, unspecified laterality - Plan: Ubrogepant (UBRELVY) 17 MG TABS  Former smoker  Osteopenia, unspecified location - Plan: VITAMIN D 25 Hydroxy (Vit-D Deficiency, Fractures)  Screening for colon cancer - Plan: Cologuard  Depression, recurrent ([place]) - Plan: citalopram (CELEXA) 20 MG tablet  Encounter for vitamin deficiency screening - Plan: VITAMIN D 25 Hydroxy (Vit-D Deficiency,  Fractures)    Discussed getting back into counseling to help with her depression.  I will place her back on Celexa and she is to set up for a virtual visit in 1 month.  Sample of Roselyn Meier given with instructions on proper use and if continued difficulty, I will refer back to neurology for possible Botox injections which apparently have been somewhat useful in the past.  Recommend vitamin D and calcium for her osteopenia.  Immunization recommendations discussed.  Colonoscopy recommendations reviewed   Medicare Attestation I have personally reviewed: The patient's medical and social history Their use of alcohol, tobacco or illicit drugs Their current medications and supplements The patient's functional ability including ADLs,fall risks, home safety risks, cognitive, and hearing and visual impairment Diet and physical activities Evidence for depression or mood disorders  The patient's weight, height, and BMI have been recorded in the chart.  I have made referrals, counseling, and provided education to the patient based on review of the above and I have provided the patient with a written personalized care plan for preventive services.     Jill Alexanders, MD   07/25/2022

## 2022-07-26 LAB — COMPREHENSIVE METABOLIC PANEL
ALT: 281 IU/L — ABNORMAL HIGH (ref 0–32)
AST: 184 IU/L — ABNORMAL HIGH (ref 0–40)
Albumin/Globulin Ratio: 2 (ref 1.2–2.2)
Albumin: 5.1 g/dL — ABNORMAL HIGH (ref 3.8–4.9)
Alkaline Phosphatase: 73 IU/L (ref 44–121)
BUN/Creatinine Ratio: 14 (ref 9–23)
BUN: 10 mg/dL (ref 6–24)
Bilirubin Total: 0.6 mg/dL (ref 0.0–1.2)
CO2: 19 mmol/L — ABNORMAL LOW (ref 20–29)
Calcium: 10.7 mg/dL — ABNORMAL HIGH (ref 8.7–10.2)
Chloride: 97 mmol/L (ref 96–106)
Creatinine, Ser: 0.71 mg/dL (ref 0.57–1.00)
Globulin, Total: 2.6 g/dL (ref 1.5–4.5)
Glucose: 98 mg/dL (ref 70–99)
Potassium: 4.7 mmol/L (ref 3.5–5.2)
Sodium: 134 mmol/L (ref 134–144)
Total Protein: 7.7 g/dL (ref 6.0–8.5)
eGFR: 101 mL/min/{1.73_m2} (ref >59)

## 2022-07-26 LAB — LIPID PANEL
Chol/HDL Ratio: 2.6 ratio (ref 0.0–4.4)
Cholesterol, Total: 251 mg/dL — ABNORMAL HIGH (ref 100–199)
HDL: 98 mg/dL (ref >39)
LDL Chol Calc (NIH): 138 mg/dL — ABNORMAL HIGH (ref 0–99)
Triglycerides: 90 mg/dL (ref 0–149)
VLDL Cholesterol Cal: 15 mg/dL (ref 5–40)

## 2022-07-26 LAB — CBC WITH DIFFERENTIAL/PLATELET
Basophils Absolute: 0.1 10*3/uL (ref 0.0–0.2)
Basos: 1 %
EOS (ABSOLUTE): 0.1 10*3/uL (ref 0.0–0.4)
Eos: 3 %
Hematocrit: 45.8 % (ref 34.0–46.6)
Hemoglobin: 15.7 g/dL (ref 11.1–15.9)
Immature Grans (Abs): 0 10*3/uL (ref 0.0–0.1)
Immature Granulocytes: 0 %
Lymphocytes Absolute: 1.4 10*3/uL (ref 0.7–3.1)
Lymphs: 29 %
MCH: 31.4 pg (ref 26.6–33.0)
MCHC: 34.3 g/dL (ref 31.5–35.7)
MCV: 92 fL (ref 79–97)
Monocytes Absolute: 0.6 10*3/uL (ref 0.1–0.9)
Monocytes: 13 %
Neutrophils Absolute: 2.6 10*3/uL (ref 1.4–7.0)
Neutrophils: 54 %
Platelets: 277 10*3/uL (ref 150–450)
RBC: 5 x10E6/uL (ref 3.77–5.28)
RDW: 12.8 % (ref 11.7–15.4)
WBC: 4.8 10*3/uL (ref 3.4–10.8)

## 2022-07-26 LAB — VITAMIN D 25 HYDROXY (VIT D DEFICIENCY, FRACTURES): Vit D, 25-Hydroxy: 24.6 ng/mL — ABNORMAL LOW (ref 30.0–100.0)

## 2022-07-26 NOTE — Addendum Note (Signed)
Addended by: Denita Lung on: 07/26/2022 08:32 AM   Modules accepted: Orders

## 2022-08-07 ENCOUNTER — Ambulatory Visit (HOSPITAL_COMMUNITY): Admission: RE | Admit: 2022-08-07 | Payer: Medicare HMO | Source: Ambulatory Visit

## 2022-08-22 DIAGNOSIS — Z1211 Encounter for screening for malignant neoplasm of colon: Secondary | ICD-10-CM | POA: Diagnosis not present

## 2022-08-28 LAB — COLOGUARD: COLOGUARD: NEGATIVE

## 2022-09-25 DIAGNOSIS — R059 Cough, unspecified: Secondary | ICD-10-CM | POA: Diagnosis not present

## 2022-09-25 DIAGNOSIS — H109 Unspecified conjunctivitis: Secondary | ICD-10-CM | POA: Diagnosis not present

## 2022-09-25 DIAGNOSIS — J329 Chronic sinusitis, unspecified: Secondary | ICD-10-CM | POA: Diagnosis not present

## 2023-07-16 ENCOUNTER — Encounter: Payer: Self-pay | Admitting: Internal Medicine

## 2024-02-05 ENCOUNTER — Telehealth (INDEPENDENT_AMBULATORY_CARE_PROVIDER_SITE_OTHER): Admitting: Medical

## 2024-02-05 VITALS — Temp 99.5°F | Wt 135.0 lb

## 2024-02-05 DIAGNOSIS — G43909 Migraine, unspecified, not intractable, without status migrainosus: Secondary | ICD-10-CM | POA: Diagnosis not present

## 2024-02-05 DIAGNOSIS — M5481 Occipital neuralgia: Secondary | ICD-10-CM

## 2024-02-05 DIAGNOSIS — J019 Acute sinusitis, unspecified: Secondary | ICD-10-CM

## 2024-02-05 MED ORDER — UBRELVY 50 MG PO TABS: 1.0000 | ORAL_TABLET | ORAL | 2 refills | Status: AC | PRN

## 2024-02-05 MED ORDER — ONDANSETRON HCL 4 MG PO TABS: 4.0000 mg | ORAL_TABLET | Freq: Three times a day (TID) | ORAL | 2 refills | Status: AC | PRN

## 2024-02-05 MED ORDER — AMOXICILLIN-POT CLAVULANATE 875-125 MG PO TABS: 1.0000 | ORAL_TABLET | Freq: Two times a day (BID) | ORAL | 0 refills | Status: AC

## 2024-02-05 NOTE — Progress Notes (Signed)
 Subjective:     Patient ID: Vanessa Sutton, female   DOB: 04/24/1966, 58 y.o.   MRN: [id number]  This visit type was conducted due to national recommendations for restrictions regarding the COVID-19 Pandemic (e.g. social distancing) in an effort to limit this patient's exposure and mitigate transmission in our community.  Due to their co-morbid illnesses, this patient is at least at moderate risk for complications without adequate follow up.  This format is felt to be most appropriate for this patient at this time.    Documentation for virtual audio and video telecommunications through [place] encounter:  The patient was located at home. The provider was located in the office. The patient did consent to this visit and is aware of possible charges through their insurance for this visit.  The other persons participating in this telemedicine service were none. Time spent on call was 20 minutes and in review of previous records 20 minutes total.  This virtual service is not related to other E/M service within previous 7 days.   HPI Chief Complaint  Patient presents with   Acute Visit    Face swollen, sinus pressure, right ear pain, dizziness. Symptoms started a week ago   She reports possible ear and sinus infection.  She notes about a week of symptoms.   She notes headache, right ear pain, dizzy, nauseated. Face seems a little swollen on right cheek.   Teeth ache.  Has had 99 temp.  Has some body aches.  No cough.  Had an episode of vomiting after draining some tea that didn't taste right.   Using Advil .  No sick contacts.  No other aggravating or relieving factors. No other complaint.  Past Medical History:  Diagnosis Date   ADHD (attention deficit hyperactivity disorder)    Atopic dermatitis    Dyslipidemia    caused by lyrica   Genital herpes    history of   H/O cold sores    Migraine headache    PUD (peptic ulcer disease)    Smoker    Current Outpatient Medications on  File Prior to Visit  Medication Sig Dispense Refill   ADDERALL  XR 30 MG 24 hr capsule Take 1 capsule (30 mg total) by mouth 2 (two) times daily. 60 capsule 0   amphetamine -dextroamphetamine (ADDERALL  XR) 30 MG 24 hr capsule Take 1 capsule (30 mg total) by mouth 2 (two) times daily. 60 capsule 0   amphetamine -dextroamphetamine (ADDERALL  XR) 30 MG 24 hr capsule Take 1 capsule (30 mg total) by mouth 2 (two) times daily. 60 capsule 0   EPINEPHRINE  0.3 mg/0.3 mL IJ SOAJ injection INJECT 0.3 MG INTO THE MUSCLE AS NEEDED FOR ANAPHYLAXIS 2 each 0   No current facility-administered medications on file prior to visit.     Review of Systems As in subjective    Objective:   Physical Exam Due to coronavirus pandemic stay at home measures, patient visit was virtual and they were not examined in person.   Temp 99.5 F (37.5 C)   Wt 135 lb (61.2 kg)   LMP 10/02/2014 (Approximate)   BMI 26.37 kg/m   General: no acute distress No labored breathing or wheezing Unable to see face due to difficulty with video today    Assessment:     Encounter Diagnoses  Name Primary?   Acute non-recurrent sinusitis, unspecified location Yes   Occipital neuralgia, unspecified laterality    Migraine without status migrainosus, not intractable, unspecified migraine type  Plan:     Sinusitis Advise rest, hydration, nasal saline flush, and Mucinex max over-the-counter to help with mucus and congestion, begin antibiotic below, Augmentin  If not much improvement in the next 3 to 4 days then call or recheck  Refilled migraine acute medication Ubrelvy  and Zofran  at her request.  Uses as needed  Tzirel was seen today for acute visit.  Diagnoses and all orders for this visit:  Acute non-recurrent sinusitis, unspecified location  Occipital neuralgia, unspecified laterality -     Ubrogepant  (UBRELVY ) 50 MG TABS; Take 1 tablet (50 mg total) by mouth as needed. -     ondansetron  (ZOFRAN ) 4 MG tablet; Take 1  tablet (4 mg total) by mouth every 8 (eight) hours as needed.  Migraine without status migrainosus, not intractable, unspecified migraine type  Other orders -     amoxicillin -clavulanate (AUGMENTIN ) 875-125 MG tablet; Take 1 tablet by mouth 2 (two) times daily.  F/u prn

## 2024-02-10 ENCOUNTER — Other Ambulatory Visit (HOSPITAL_COMMUNITY): Payer: Self-pay

## 2024-02-10 ENCOUNTER — Telehealth: Payer: Self-pay

## 2024-02-10 NOTE — Telephone Encounter (Signed)
 Pharmacy Patient Advocate Encounter   Received notification from Onbase that prior authorization for UBRELVY  50MG  is required/requested.   Insurance verification completed.   The patient is insured through [employer] .   Per test claim: PA required; PA submitted to above mentioned insurance via Latent Key/confirmation #/EOC [id number]   Status is pending

## 2024-02-11 ENCOUNTER — Other Ambulatory Visit (HOSPITAL_COMMUNITY): Payer: Self-pay

## 2024-02-11 NOTE — Telephone Encounter (Signed)
 Pharmacy Patient Advocate Encounter  Received notification from [employer] that Prior Authorization for UBRELVY  50MG  has been APPROVED from 02/10/2024 to 05/20/2024. Ran test claim, Copay is $0.00. This test claim was processed through [facility]- copay amounts may vary at other pharmacies due to pharmacy/plan contracts, or as the patient moves through the different stages of their insurance plan.   PA #/Case ID/Reference #: [id number]

## 2024-06-09 ENCOUNTER — Other Ambulatory Visit (HOSPITAL_COMMUNITY): Payer: Self-pay

## 2024-06-09 ENCOUNTER — Telehealth: Payer: Self-pay | Admitting: Pharmacy Technician

## 2024-06-09 NOTE — Telephone Encounter (Signed)
 Pharmacy Patient Advocate Encounter  Received notification from [employer] that Prior Authorization for Ubrelvy  50MG  tablets has been APPROVED from 02/19/2024 to 05/20/2025.   PA #/Case ID/Reference #: [id number]
